# Patient Record
Sex: Male | Born: 1950 | Race: White | Hispanic: No | Smoking: Former smoker
Health system: Southern US, Community
[De-identification: ages and names within clinical notes are randomized; demographics above are authoritative.]

## PROBLEM LIST (undated history)

## (undated) DIAGNOSIS — E119 Type 2 diabetes mellitus without complications: Secondary | ICD-10-CM

## (undated) DIAGNOSIS — H9190 Unspecified hearing loss, unspecified ear: Secondary | ICD-10-CM

## (undated) DIAGNOSIS — I10 Essential (primary) hypertension: Secondary | ICD-10-CM

---

## 2021-05-23 ENCOUNTER — Other Ambulatory Visit (HOSPITAL_COMMUNITY): Payer: Medicare Other

## 2021-05-23 ENCOUNTER — Institutional Professional Consult (permissible substitution)
Admission: RE | Admit: 2021-05-23 | Discharge: 2021-06-03 | Disposition: A | Discharge status: Rehabilitation Facility | Payer: Medicare Other | Source: Ambulatory Visit | Attending: Internal Medicine | Admitting: Internal Medicine

## 2021-05-23 DIAGNOSIS — I824Z9 Acute embolism and thrombosis of unspecified deep veins of unspecified distal lower extremity: Secondary | ICD-10-CM

## 2021-05-23 DIAGNOSIS — Z931 Gastrostomy status: Secondary | ICD-10-CM

## 2021-05-23 DIAGNOSIS — J9621 Acute and chronic respiratory failure with hypoxia: Secondary | ICD-10-CM

## 2021-05-23 DIAGNOSIS — S066X9S Traumatic subarachnoid hemorrhage with loss of consciousness of unspecified duration, sequela: Secondary | ICD-10-CM

## 2021-05-23 DIAGNOSIS — S27322A Contusion of lung, bilateral, initial encounter: Secondary | ICD-10-CM

## 2021-05-23 DIAGNOSIS — J189 Pneumonia, unspecified organism: Secondary | ICD-10-CM

## 2021-05-23 DIAGNOSIS — J398 Other specified diseases of upper respiratory tract: Secondary | ICD-10-CM

## 2021-05-23 MED ORDER — DIATRIZOATE MEGLUMINE & SODIUM 66-10 % PO SOLN
ORAL | Status: AC
  Administered 2021-05-23: 20 mL via GASTROSTOMY
  Filled 2021-05-23: qty 30

## 2021-05-24 ENCOUNTER — Other Ambulatory Visit (HOSPITAL_COMMUNITY): Payer: Medicare Other

## 2021-05-24 DIAGNOSIS — J9621 Acute and chronic respiratory failure with hypoxia: Secondary | ICD-10-CM | POA: Diagnosis not present

## 2021-05-24 DIAGNOSIS — S27322A Contusion of lung, bilateral, initial encounter: Secondary | ICD-10-CM

## 2021-05-24 DIAGNOSIS — J398 Other specified diseases of upper respiratory tract: Secondary | ICD-10-CM

## 2021-05-24 DIAGNOSIS — I824Z9 Acute embolism and thrombosis of unspecified deep veins of unspecified distal lower extremity: Secondary | ICD-10-CM | POA: Diagnosis not present

## 2021-05-24 DIAGNOSIS — S066X9S Traumatic subarachnoid hemorrhage with loss of consciousness of unspecified duration, sequela: Secondary | ICD-10-CM

## 2021-05-24 DIAGNOSIS — S27322S Contusion of lung, bilateral, sequela: Secondary | ICD-10-CM | POA: Diagnosis not present

## 2021-05-24 LAB — BASIC METABOLIC PANEL
Anion gap: 14 (ref 5–15)
BUN: 22 mg/dL (ref 8–23)
CO2: 25 mmol/L (ref 22–32)
Calcium: 8.7 mg/dL — ABNORMAL LOW (ref 8.9–10.3)
Chloride: 100 mmol/L (ref 98–111)
Creatinine, Ser: 0.96 mg/dL (ref 0.61–1.24)
GFR, Estimated: >60 mL/min (ref >60)
Glucose, Bld: 179 mg/dL — ABNORMAL HIGH (ref 70–99)
Potassium: 4 mmol/L (ref 3.5–5.1)
Sodium: 139 mmol/L (ref 135–145)

## 2021-05-24 LAB — CBC
HCT: 36.7 % — ABNORMAL LOW (ref 39.0–52.0)
Hemoglobin: 11.9 g/dL — ABNORMAL LOW (ref 13.0–17.0)
MCH: 30.8 pg (ref 26.0–34.0)
MCHC: 32.4 g/dL (ref 30.0–36.0)
MCV: 95.1 fL (ref 80.0–100.0)
Platelets: 145 10*3/uL — ABNORMAL LOW (ref 150–400)
RBC: 3.86 MIL/uL — ABNORMAL LOW (ref 4.22–5.81)
RDW: 15.2 % (ref 11.5–15.5)
WBC: 6.8 10*3/uL (ref 4.0–10.5)
nRBC: 0 % (ref 0.0–0.2)

## 2021-05-24 LAB — URINALYSIS, ROUTINE W REFLEX MICROSCOPIC
Bilirubin Urine: NEGATIVE
Glucose, UA: NEGATIVE mg/dL
Hgb urine dipstick: NEGATIVE
Ketones, ur: NEGATIVE mg/dL
Leukocytes,Ua: NEGATIVE
Nitrite: NEGATIVE
Protein, ur: NEGATIVE mg/dL
Specific Gravity, Urine: 1.02 (ref 1.005–1.030)
pH: 6 (ref 5.0–8.0)
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
## 2021-05-25 DIAGNOSIS — I824Z9 Acute embolism and thrombosis of unspecified deep veins of unspecified distal lower extremity: Secondary | ICD-10-CM | POA: Diagnosis not present

## 2021-05-25 DIAGNOSIS — J398 Other specified diseases of upper respiratory tract: Secondary | ICD-10-CM | POA: Diagnosis not present

## 2021-05-25 DIAGNOSIS — J9621 Acute and chronic respiratory failure with hypoxia: Secondary | ICD-10-CM | POA: Diagnosis not present

## 2021-05-25 DIAGNOSIS — S27322S Contusion of lung, bilateral, sequela: Secondary | ICD-10-CM | POA: Diagnosis not present

## 2021-05-25 LAB — CBC
HCT: 39 % (ref 39.0–52.0)
Hemoglobin: 12.6 g/dL — ABNORMAL LOW (ref 13.0–17.0)
MCH: 31 pg (ref 26.0–34.0)
MCHC: 32.3 g/dL (ref 30.0–36.0)
MCV: 95.8 fL (ref 80.0–100.0)
Platelets: 149 10*3/uL — ABNORMAL LOW (ref 150–400)
RBC: 4.07 MIL/uL — ABNORMAL LOW (ref 4.22–5.81)
RDW: 15 % (ref 11.5–15.5)
WBC: 7.3 10*3/uL (ref 4.0–10.5)
nRBC: 0 % (ref 0.0–0.2)

## 2021-05-25 LAB — COMPREHENSIVE METABOLIC PANEL
ALT: 22 U/L (ref 0–44)
AST: 30 U/L (ref 15–41)
Albumin: 2.5 g/dL — ABNORMAL LOW (ref 3.5–5.0)
Alkaline Phosphatase: 81 U/L (ref 38–126)
Anion gap: 6 (ref 5–15)
BUN: 30 mg/dL — ABNORMAL HIGH (ref 8–23)
CO2: 30 mmol/L (ref 22–32)
Calcium: 9.1 mg/dL (ref 8.9–10.3)
Chloride: 104 mmol/L (ref 98–111)
Creatinine, Ser: 1.02 mg/dL (ref 0.61–1.24)
GFR, Estimated: >60 mL/min (ref >60)
Glucose, Bld: 91 mg/dL (ref 70–99)
Potassium: 4 mmol/L (ref 3.5–5.1)
Sodium: 140 mmol/L (ref 135–145)
Total Bilirubin: 0.6 mg/dL (ref 0.3–1.2)
Total Protein: 6.5 g/dL (ref 6.5–8.1)

## 2021-05-25 LAB — TSH: TSH: 1.078 u[IU]/mL (ref 0.350–4.500)

## 2021-05-25 LAB — URINE CULTURE: Culture: NO GROWTH

## 2021-05-25 LAB — HEMOGLOBIN A1C
Hgb A1c MFr Bld: 6.9 % — ABNORMAL HIGH (ref 4.8–5.6)
Mean Plasma Glucose: 151.33 mg/dL

## 2021-05-25 LAB — PHOSPHORUS: Phosphorus: 4 mg/dL (ref 2.5–4.6)

## 2021-05-25 LAB — MAGNESIUM: Magnesium: 2.1 mg/dL (ref 1.7–2.4)

## 2021-05-25 NOTE — Progress Notes (Signed)
Pulmonary Critical Care Medicine [facility] [place]   PULMONARY CRITICAL CARE SERVICE  PROGRESS NOTE     John Bryan  [id number]  DOB: 08-07-1951   DOA: 05/23/2021  Referring Physician: Carron Curie, MD  HPI: John Bryan is a 70 y.o. male being followed for ventilator/airway/oxygen weaning Acute on Chronic Respiratory Failure.  Patient at this time is comfortable right now without distress has been on T collar on 28% of 2 using the PMV  Medications: Reviewed on Rounds  Physical Exam:  Vitals: Temperature is 98.7 pulse 89 respiratory 30 blood pressure is 122/60 saturations 99%  Ventilator Settings on T collar FiO2 28% using PMV  General: Comfortable at this time Neck: supple Cardiovascular: no malignant arrhythmias Respiratory: No rhonchi very coarse breath sound Skin: no rash seen on limited exam Musculoskeletal: No gross abnormality Psychiatric:unable to assess Neurologic:no involuntary movements         Lab Data:   Basic Metabolic Panel: Recent Labs  Lab 05/24/21 0349 05/25/21 0424  NA 139 140  K 4.0 4.0  CL 100 104  CO2 25 30  GLUCOSE 179* 91  BUN 22 30*  CREATININE 0.96 1.02  CALCIUM 8.7* 9.1  MG  --  2.1  PHOS  --  4.0    ABG: No results for input(s): PHART, PCO2ART, PO2ART, HCO3, O2SAT in the last 168 hours.  Liver Function Tests: Recent Labs  Lab 05/25/21 0424  AST 30  ALT 22  ALKPHOS 81  BILITOT 0.6  PROT 6.5  ALBUMIN 2.5*   No results for input(s): LIPASE, AMYLASE in the last 168 hours. No results for input(s): AMMONIA in the last 168 hours.  CBC: Recent Labs  Lab 05/24/21 0349 05/25/21 0424  WBC 6.8 7.3  HGB 11.9* 12.6*  HCT 36.7* 39.0  MCV 95.1 95.8  PLT 145* 149*    Cardiac Enzymes: No results for input(s): CKTOTAL, CKMB, CKMBINDEX, TROPONINI in the last 168 hours.  BNP (last 3 results) No results for input(s): BNP in the last 8760 hours.  ProBNP (last 3 results) No results for input(s):  PROBNP in the last 8760 hours.  Radiological Exams: DG ABDOMEN PEG TUBE LOCATION  Result Date: 05/23/2021 CLINICAL DATA:  Check gastric catheter placement EXAM: ABDOMEN - 1 VIEW COMPARISON:  None. FINDINGS: Contrast material injected through the gastrostomy flows freely into the stomach. No other focal abnormality is noted. IMPRESSION: Gastrostomy catheter within the stomach. Electronically Signed   By: Alcide Clever M.D.   On: 05/23/2021 23:10   DG Chest Port 1 View  Result Date: 05/24/2021 CLINICAL DATA:  Pneumonia. EXAM: PORTABLE CHEST 1 VIEW COMPARISON:  No prior. FINDINGS: Tracheostomy tube noted good anatomic position. Heart size normal. Low lung volumes. No focal infiltrate. No pleural effusion or pneumothorax. Prior cervical spine fusion. IMPRESSION: 1.  Tracheostomy tube noted good anatomic position. 2.  Low lung volumes.  No acute infiltrate. Electronically Signed   By: Maisie Fus  Register   On: 05/24/2021 12:00    Assessment/Plan Active Problems:   Acute on chronic respiratory failure with hypoxia (HCC)   Increased tracheal secretions   Traumatic subarachnoid hemorrhage with loss of consciousness of unspecified duration, sequela (HCC)   DVT, lower extremity, distal, acute (HCC)   Bilateral pulmonary contusion   Acute on chronic respiratory failure with hypoxia patient has been tolerating the PMV doing well.  Plan is going to be to continue to advance as tolerated.  The patient's daughter was at bedside she was updated.  Explained  that the reason that he has mittens on basically is because he does have a tendency to pull at his lines and catheter Increased tracheal secretions requires aggressive pulmonary toilet we will continue to monitor closely. Traumatic subarachnoid hemorrhage no change we will continue to follow along closely. DVT treated supportive care Bilateral pulmonary contusions slow to improve we will continue to monitor closely.   I have personally seen and evaluated  the patient, evaluated laboratory and imaging results, formulated the assessment and plan and placed orders. The Patient requires high complexity decision making with multiple systems involvement.  Rounds were done with the Respiratory Therapy Director and Staff therapists and discussed with nursing staff also.  Yevonne Pax, MD [facility] Pulmonary Critical Care Medicine Sleep Medicine

## 2021-05-26 DIAGNOSIS — S27322S Contusion of lung, bilateral, sequela: Secondary | ICD-10-CM | POA: Diagnosis not present

## 2021-05-26 DIAGNOSIS — I824Z9 Acute embolism and thrombosis of unspecified deep veins of unspecified distal lower extremity: Secondary | ICD-10-CM | POA: Diagnosis not present

## 2021-05-26 DIAGNOSIS — J9621 Acute and chronic respiratory failure with hypoxia: Secondary | ICD-10-CM | POA: Diagnosis not present

## 2021-05-26 DIAGNOSIS — J398 Other specified diseases of upper respiratory tract: Secondary | ICD-10-CM | POA: Diagnosis not present

## 2021-05-26 LAB — CULTURE, RESPIRATORY W GRAM STAIN

## 2021-05-26 NOTE — Progress Notes (Signed)
Pulmonary Critical Care Medicine [facility] [place]   PULMONARY CRITICAL CARE SERVICE  PROGRESS NOTE     GAD AYMOND  OVZ:[id number]  DOB: 09/16/1951   DOA: 05/23/2021  Referring Physician: Carron Curie, MD  HPI: John Bryan is a 70 y.o. male being followed for ventilator/airway/oxygen weaning Acute on Chronic Respiratory Failure.  Patient is on T collar at this time patient is comfortable and ready for capping trials  Medications: Reviewed on Rounds  Physical Exam:  Vitals: Temperature is 98.8 pulse 83 respiratory 23 blood pressure is 124/66 saturations 98%  Ventilator Settings on T collar FiO2 28%  General: Comfortable at this time Neck: supple Cardiovascular: no malignant arrhythmias Respiratory: No rhonchi no rales are noted at this time Skin: no rash seen on limited exam Musculoskeletal: No gross abnormality Psychiatric:unable to assess Neurologic:no involuntary movements         Lab Data:   Basic Metabolic Panel: Recent Labs  Lab 05/24/21 0349 05/25/21 0424  NA 139 140  K 4.0 4.0  CL 100 104  CO2 25 30  GLUCOSE 179* 91  BUN 22 30*  CREATININE 0.96 1.02  CALCIUM 8.7* 9.1  MG  --  2.1  PHOS  --  4.0    ABG: No results for input(s): PHART, PCO2ART, PO2ART, HCO3, O2SAT in the last 168 hours.  Liver Function Tests: Recent Labs  Lab 05/25/21 0424  AST 30  ALT 22  ALKPHOS 81  BILITOT 0.6  PROT 6.5  ALBUMIN 2.5*   No results for input(s): LIPASE, AMYLASE in the last 168 hours. No results for input(s): AMMONIA in the last 168 hours.  CBC: Recent Labs  Lab 05/24/21 0349 05/25/21 0424  WBC 6.8 7.3  HGB 11.9* 12.6*  HCT 36.7* 39.0  MCV 95.1 95.8  PLT 145* 149*    Cardiac Enzymes: No results for input(s): CKTOTAL, CKMB, CKMBINDEX, TROPONINI in the last 168 hours.  BNP (last 3 results) No results for input(s): BNP in the last 8760 hours.  ProBNP (last 3 results) No results for input(s): PROBNP in the last 8760  hours.  Radiological Exams: DG Chest Port 1 View  Result Date: 05/24/2021 CLINICAL DATA:  Pneumonia. EXAM: PORTABLE CHEST 1 VIEW COMPARISON:  No prior. FINDINGS: Tracheostomy tube noted good anatomic position. Heart size normal. Low lung volumes. No focal infiltrate. No pleural effusion or pneumothorax. Prior cervical spine fusion. IMPRESSION: 1.  Tracheostomy tube noted good anatomic position. 2.  Low lung volumes.  No acute infiltrate. Electronically Signed   By: Maisie Fus  Register   On: 05/24/2021 12:00    Assessment/Plan Active Problems:   Acute on chronic respiratory failure with hypoxia (HCC)   Increased tracheal secretions   Traumatic subarachnoid hemorrhage with loss of consciousness of unspecified duration, sequela (HCC)   DVT, lower extremity, distal, acute (HCC)   Bilateral pulmonary contusion   Acute on chronic respiratory failure with hypoxia we will continue with T-piece today we will attempt capping if patient is able to tolerate will advance Traumatic subarachnoid hemorrhage no change DVT treated we will continue with supportive care Pulmonary contusion low lung volumes noted on the last chest film no pneumothorax no infiltrate Retained secretions continue with aggressive pulmonary toilet   I have personally seen and evaluated the patient, evaluated laboratory and imaging results, formulated the assessment and plan and placed orders. The Patient requires high complexity decision making with multiple systems involvement.  Rounds were done with the Respiratory Therapy Director and Staff therapists and discussed  with nursing staff also.  Allyne Gee, MD [facility] Pulmonary Critical Care Medicine Sleep Medicine

## 2021-05-27 DIAGNOSIS — I824Z9 Acute embolism and thrombosis of unspecified deep veins of unspecified distal lower extremity: Secondary | ICD-10-CM | POA: Diagnosis not present

## 2021-05-27 DIAGNOSIS — S27322S Contusion of lung, bilateral, sequela: Secondary | ICD-10-CM | POA: Diagnosis not present

## 2021-05-27 DIAGNOSIS — J9621 Acute and chronic respiratory failure with hypoxia: Secondary | ICD-10-CM | POA: Diagnosis not present

## 2021-05-27 DIAGNOSIS — J398 Other specified diseases of upper respiratory tract: Secondary | ICD-10-CM | POA: Diagnosis not present

## 2021-05-27 LAB — BASIC METABOLIC PANEL
Anion gap: 8 (ref 5–15)
BUN: 30 mg/dL — ABNORMAL HIGH (ref 8–23)
CO2: 31 mmol/L (ref 22–32)
Calcium: 9 mg/dL (ref 8.9–10.3)
Chloride: 104 mmol/L (ref 98–111)
Creatinine, Ser: 1 mg/dL (ref 0.61–1.24)
GFR, Estimated: >60 mL/min (ref >60)
Glucose, Bld: 122 mg/dL — ABNORMAL HIGH (ref 70–99)
Potassium: 3.9 mmol/L (ref 3.5–5.1)
Sodium: 143 mmol/L (ref 135–145)

## 2021-05-27 LAB — CBC
HCT: 35.8 % — ABNORMAL LOW (ref 39.0–52.0)
Hemoglobin: 11.2 g/dL — ABNORMAL LOW (ref 13.0–17.0)
MCH: 30.4 pg (ref 26.0–34.0)
MCHC: 31.3 g/dL (ref 30.0–36.0)
MCV: 97 fL (ref 80.0–100.0)
Platelets: 169 10*3/uL (ref 150–400)
RBC: 3.69 MIL/uL — ABNORMAL LOW (ref 4.22–5.81)
RDW: 15 % (ref 11.5–15.5)
WBC: 7.5 10*3/uL (ref 4.0–10.5)
nRBC: 0 % (ref 0.0–0.2)

## 2021-05-27 LAB — MAGNESIUM: Magnesium: 1.9 mg/dL (ref 1.7–2.4)

## 2021-05-27 LAB — PHOSPHORUS: Phosphorus: 4 mg/dL (ref 2.5–4.6)

## 2021-05-27 NOTE — Progress Notes (Signed)
Pulmonary Critical Care Medicine [facility] [place]   PULMONARY CRITICAL CARE SERVICE  PROGRESS NOTE     John Bryan  [id number]:[id number]  DOB: 09-Jul-1951   DOA: 05/23/2021  Referring Physician: Carron Curie, MD  HPI: John Bryan is a 70 y.o. male being followed for ventilator/airway/oxygen weaning Acute on Chronic Respiratory Failure.  Patient this morning is capping looks good has been tolerating it without any issues.  Medications: Reviewed on Rounds  Physical Exam:  Vitals: Temperature is 98.0 pulse 85 respiratory 16 blood pressure 119/70 saturations 97%  Ventilator Settings capping off the ventilator  General: Comfortable at this time Neck: supple Cardiovascular: no malignant arrhythmias Respiratory: No rhonchi very coarse percent Skin: no rash seen on limited exam Musculoskeletal: No gross abnormality Psychiatric:unable to assess Neurologic:no involuntary movements         Lab Data:   Basic Metabolic Panel: Recent Labs  Lab 05/24/21 0349 05/25/21 0424 05/27/21 0427  NA 139 140 143  K 4.0 4.0 3.9  CL 100 104 104  CO2 25 30 31   GLUCOSE 179* 91 122*  BUN 22 30* 30*  CREATININE 0.96 1.02 1.00  CALCIUM 8.7* 9.1 9.0  MG  --  2.1 1.9  PHOS  --  4.0 4.0    ABG: No results for input(s): PHART, PCO2ART, PO2ART, HCO3, O2SAT in the last 168 hours.  Liver Function Tests: Recent Labs  Lab 05/25/21 0424  AST 30  ALT 22  ALKPHOS 81  BILITOT 0.6  PROT 6.5  ALBUMIN 2.5*   No results for input(s): LIPASE, AMYLASE in the last 168 hours. No results for input(s): AMMONIA in the last 168 hours.  CBC: Recent Labs  Lab 05/24/21 0349 05/25/21 0424 05/27/21 0427  WBC 6.8 7.3 7.5  HGB 11.9* 12.6* 11.2*  HCT 36.7* 39.0 35.8*  MCV 95.1 95.8 97.0  PLT 145* 149* 169    Cardiac Enzymes: No results for input(s): CKTOTAL, CKMB, CKMBINDEX, TROPONINI in the last 168 hours.  BNP (last 3 results) No results for input(s): BNP in the last 8760  hours.  ProBNP (last 3 results) No results for input(s): PROBNP in the last 8760 hours.  Radiological Exams: No results found.  Assessment/Plan Active Problems:   Acute on chronic respiratory failure with hypoxia (HCC)   Increased tracheal secretions   Traumatic subarachnoid hemorrhage with loss of consciousness of unspecified duration, sequela (HCC)   DVT, lower extremity, distal, acute (HCC)   Bilateral pulmonary contusion   Acute on chronic respiratory failure with hypoxia we will continue with capping and weaning as tolerated.  Continue secretion management supportive care. Increased tracheal secretions no change we will continue to follow along closely Subarachnoid hemorrhage supportive care DVT has been on anticoagulation Bilateral pulmonary contusions showing improvement   I have personally seen and evaluated the patient, evaluated laboratory and imaging results, formulated the assessment and plan and placed orders. The Patient requires high complexity decision making with multiple systems involvement.  Rounds were done with the Respiratory Therapy Director and Staff therapists and discussed with nursing staff also.  05/29/21, MD [facility] Pulmonary Critical Care Medicine Sleep Medicine

## 2021-05-29 LAB — CBC
HCT: 37.7 % — ABNORMAL LOW (ref 39.0–52.0)
Hemoglobin: 11.5 g/dL — ABNORMAL LOW (ref 13.0–17.0)
MCH: 29.9 pg (ref 26.0–34.0)
MCHC: 30.5 g/dL (ref 30.0–36.0)
MCV: 97.9 fL (ref 80.0–100.0)
Platelets: 216 10*3/uL (ref 150–400)
RBC: 3.85 MIL/uL — ABNORMAL LOW (ref 4.22–5.81)
RDW: 14.9 % (ref 11.5–15.5)
WBC: 6.8 10*3/uL (ref 4.0–10.5)
nRBC: 0 % (ref 0.0–0.2)

## 2021-05-29 LAB — PHOSPHORUS: Phosphorus: 4.4 mg/dL (ref 2.5–4.6)

## 2021-05-29 LAB — BASIC METABOLIC PANEL
Anion gap: 5 (ref 5–15)
BUN: 30 mg/dL — ABNORMAL HIGH (ref 8–23)
CO2: 30 mmol/L (ref 22–32)
Calcium: 9.1 mg/dL (ref 8.9–10.3)
Chloride: 108 mmol/L (ref 98–111)
Creatinine, Ser: 0.99 mg/dL (ref 0.61–1.24)
GFR, Estimated: >60 mL/min (ref >60)
Glucose, Bld: 95 mg/dL (ref 70–99)
Potassium: 3.7 mmol/L (ref 3.5–5.1)
Sodium: 143 mmol/L (ref 135–145)

## 2021-05-29 LAB — MAGNESIUM: Magnesium: 1.8 mg/dL (ref 1.7–2.4)

## 2021-05-30 DIAGNOSIS — I824Z9 Acute embolism and thrombosis of unspecified deep veins of unspecified distal lower extremity: Secondary | ICD-10-CM | POA: Diagnosis not present

## 2021-05-30 DIAGNOSIS — J9621 Acute and chronic respiratory failure with hypoxia: Secondary | ICD-10-CM | POA: Diagnosis not present

## 2021-05-30 DIAGNOSIS — J398 Other specified diseases of upper respiratory tract: Secondary | ICD-10-CM | POA: Diagnosis not present

## 2021-05-30 DIAGNOSIS — S27322S Contusion of lung, bilateral, sequela: Secondary | ICD-10-CM | POA: Diagnosis not present

## 2021-05-31 NOTE — Progress Notes (Signed)
Pulmonary Critical Care Medicine [facility] [place]   PULMONARY CRITICAL CARE SERVICE  PROGRESS NOTE     John Bryan  [id number]  DOB: Oct 27, 1951   DOA: 05/23/2021  Referring Physician: Carron Curie, MD  HPI: John Bryan is a 70 y.o. male being followed for ventilator/airway/oxygen weaning Acute on Chronic Respiratory Failure.  Patient is capping on room air has done quite well and is ready for decannulation today  Medications: Reviewed on Rounds  Physical Exam:  Vitals: Temperature is 97.3 pulse 70 respiratory 22 blood pressure is 150/67 saturations 99%  Ventilator Settings capping on room air  General: Comfortable at this time Neck: supple Cardiovascular: no malignant arrhythmias Respiratory: No rhonchi no rales are noted at this time Skin: no rash seen on limited exam Musculoskeletal: No gross abnormality Psychiatric:unable to assess Neurologic:no involuntary movements         Lab Data:   Basic Metabolic Panel: Recent Labs  Lab 05/25/21 0424 05/27/21 0427 05/29/21 0339  NA 140 143 143  K 4.0 3.9 3.7  CL 104 104 108  CO2 30 31 30   GLUCOSE 91 122* 95  BUN 30* 30* 30*  CREATININE 1.02 1.00 0.99  CALCIUM 9.1 9.0 9.1  MG 2.1 1.9 1.8  PHOS 4.0 4.0 4.4    ABG: No results for input(s): PHART, PCO2ART, PO2ART, HCO3, O2SAT in the last 168 hours.  Liver Function Tests: Recent Labs  Lab 05/25/21 0424  AST 30  ALT 22  ALKPHOS 81  BILITOT 0.6  PROT 6.5  ALBUMIN 2.5*   No results for input(s): LIPASE, AMYLASE in the last 168 hours. No results for input(s): AMMONIA in the last 168 hours.  CBC: Recent Labs  Lab 05/25/21 0424 05/27/21 0427 05/29/21 0339  WBC 7.3 7.5 6.8  HGB 12.6* 11.2* 11.5*  HCT 39.0 35.8* 37.7*  MCV 95.8 97.0 97.9  PLT 149* 169 216    Cardiac Enzymes: No results for input(s): CKTOTAL, CKMB, CKMBINDEX, TROPONINI in the last 168 hours.  BNP (last 3 results) No results for input(s): BNP in the last  8760 hours.  ProBNP (last 3 results) No results for input(s): PROBNP in the last 8760 hours.  Radiological Exams: No results found.  Assessment/Plan Active Problems:   Acute on chronic respiratory failure with hypoxia (HCC)   Increased tracheal secretions   Traumatic subarachnoid hemorrhage with loss of consciousness of unspecified duration, sequela (HCC)   DVT, lower extremity, distal, acute (HCC)   Bilateral pulmonary contusion   Acute on chronic respiratory failure hypoxia plan is to proceed to decannulation Tracheal secretions have improved clinically we will continue to monitor. Traumatic subarachnoid hemorrhage no change we will continue with supportive care DVT treated Pulmonary contusion with supportive care clinically is improved   I have personally seen and evaluated the patient, evaluated laboratory and imaging results, formulated the assessment and plan and placed orders. The Patient requires high complexity decision making with multiple systems involvement.  Rounds were done with the Respiratory Therapy Director and Staff therapists and discussed with nursing staff also.  05/31/21, MD [facility] Pulmonary Critical Care Medicine Sleep Medicine

## 2021-06-02 ENCOUNTER — Encounter: Payer: Self-pay | Admitting: Physical Therapy

## 2021-06-02 LAB — BASIC METABOLIC PANEL
Anion gap: 7 (ref 5–15)
BUN: 26 mg/dL — ABNORMAL HIGH (ref 8–23)
CO2: 29 mmol/L (ref 22–32)
Calcium: 8.7 mg/dL — ABNORMAL LOW (ref 8.9–10.3)
Chloride: 101 mmol/L (ref 98–111)
Creatinine, Ser: 0.94 mg/dL (ref 0.61–1.24)
GFR, Estimated: >60 mL/min (ref >60)
Glucose, Bld: 141 mg/dL — ABNORMAL HIGH (ref 70–99)
Potassium: 3.8 mmol/L (ref 3.5–5.1)
Sodium: 137 mmol/L (ref 135–145)

## 2021-06-02 LAB — CBC
HCT: 40.8 % (ref 39.0–52.0)
Hemoglobin: 13.1 g/dL (ref 13.0–17.0)
MCH: 30.1 pg (ref 26.0–34.0)
MCHC: 32.1 g/dL (ref 30.0–36.0)
MCV: 93.8 fL (ref 80.0–100.0)
Platelets: 292 10*3/uL (ref 150–400)
RBC: 4.35 MIL/uL (ref 4.22–5.81)
RDW: 14.6 % (ref 11.5–15.5)
WBC: 7.8 10*3/uL (ref 4.0–10.5)
nRBC: 0 % (ref 0.0–0.2)
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
## 2021-06-03 ENCOUNTER — Inpatient Hospital Stay (HOSPITAL_COMMUNITY)
Admission: RE | Admit: 2021-06-03 | Discharge: 2021-07-05 | DRG: 092 | Disposition: A | Discharge status: Home Health | Payer: No Typology Code available for payment source | Source: Other Acute Inpatient Hospital | Attending: Physical Medicine & Rehabilitation | Admitting: Physical Medicine & Rehabilitation

## 2021-06-03 ENCOUNTER — Other Ambulatory Visit: Payer: Self-pay

## 2021-06-03 ENCOUNTER — Encounter (HOSPITAL_COMMUNITY): Payer: Self-pay | Admitting: Physical Medicine & Rehabilitation

## 2021-06-03 DIAGNOSIS — G4700 Insomnia, unspecified: Secondary | ICD-10-CM | POA: Diagnosis present

## 2021-06-03 DIAGNOSIS — K219 Gastro-esophageal reflux disease without esophagitis: Secondary | ICD-10-CM | POA: Diagnosis present

## 2021-06-03 DIAGNOSIS — S2241XD Multiple fractures of ribs, right side, subsequent encounter for fracture with routine healing: Secondary | ICD-10-CM | POA: Diagnosis not present

## 2021-06-03 DIAGNOSIS — R451 Restlessness and agitation: Secondary | ICD-10-CM | POA: Diagnosis not present

## 2021-06-03 DIAGNOSIS — Z7901 Long term (current) use of anticoagulants: Secondary | ICD-10-CM

## 2021-06-03 DIAGNOSIS — H9190 Unspecified hearing loss, unspecified ear: Secondary | ICD-10-CM | POA: Diagnosis present

## 2021-06-03 DIAGNOSIS — M47816 Spondylosis without myelopathy or radiculopathy, lumbar region: Secondary | ICD-10-CM | POA: Diagnosis present

## 2021-06-03 DIAGNOSIS — E11649 Type 2 diabetes mellitus with hypoglycemia without coma: Secondary | ICD-10-CM | POA: Diagnosis not present

## 2021-06-03 DIAGNOSIS — D72829 Elevated white blood cell count, unspecified: Secondary | ICD-10-CM | POA: Diagnosis not present

## 2021-06-03 DIAGNOSIS — R1312 Dysphagia, oropharyngeal phase: Secondary | ICD-10-CM | POA: Diagnosis not present

## 2021-06-03 DIAGNOSIS — A0472 Enterocolitis due to Clostridium difficile, not specified as recurrent: Secondary | ICD-10-CM | POA: Diagnosis present

## 2021-06-03 DIAGNOSIS — S069XAA Unspecified intracranial injury with loss of consciousness status unknown, initial encounter: Secondary | ICD-10-CM | POA: Diagnosis present

## 2021-06-03 DIAGNOSIS — Z86718 Personal history of other venous thrombosis and embolism: Secondary | ICD-10-CM

## 2021-06-03 DIAGNOSIS — M7062 Trochanteric bursitis, left hip: Secondary | ICD-10-CM | POA: Diagnosis present

## 2021-06-03 DIAGNOSIS — S62637D Displaced fracture of distal phalanx of left little finger, subsequent encounter for fracture with routine healing: Secondary | ICD-10-CM | POA: Diagnosis not present

## 2021-06-03 DIAGNOSIS — G44319 Acute post-traumatic headache, not intractable: Secondary | ICD-10-CM | POA: Diagnosis not present

## 2021-06-03 DIAGNOSIS — E1169 Type 2 diabetes mellitus with other specified complication: Secondary | ICD-10-CM | POA: Diagnosis not present

## 2021-06-03 DIAGNOSIS — K760 Fatty (change of) liver, not elsewhere classified: Secondary | ICD-10-CM | POA: Diagnosis present

## 2021-06-03 DIAGNOSIS — Z79899 Other long term (current) drug therapy: Secondary | ICD-10-CM

## 2021-06-03 DIAGNOSIS — R197 Diarrhea, unspecified: Secondary | ICD-10-CM | POA: Diagnosis not present

## 2021-06-03 DIAGNOSIS — K909 Intestinal malabsorption, unspecified: Secondary | ICD-10-CM | POA: Diagnosis not present

## 2021-06-03 DIAGNOSIS — M5136 Other intervertebral disc degeneration, lumbar region: Secondary | ICD-10-CM | POA: Diagnosis present

## 2021-06-03 DIAGNOSIS — S0240CD Maxillary fracture, right side, subsequent encounter for fracture with routine healing: Secondary | ICD-10-CM | POA: Diagnosis not present

## 2021-06-03 DIAGNOSIS — Z87891 Personal history of nicotine dependence: Secondary | ICD-10-CM

## 2021-06-03 DIAGNOSIS — S069X0S Unspecified intracranial injury without loss of consciousness, sequela: Secondary | ICD-10-CM | POA: Diagnosis not present

## 2021-06-03 DIAGNOSIS — E039 Hypothyroidism, unspecified: Secondary | ICD-10-CM | POA: Diagnosis present

## 2021-06-03 DIAGNOSIS — G44311 Acute post-traumatic headache, intractable: Secondary | ICD-10-CM | POA: Diagnosis not present

## 2021-06-03 DIAGNOSIS — D649 Anemia, unspecified: Secondary | ICD-10-CM | POA: Diagnosis present

## 2021-06-03 DIAGNOSIS — S0240FD Zygomatic fracture, left side, subsequent encounter for fracture with routine healing: Secondary | ICD-10-CM | POA: Diagnosis not present

## 2021-06-03 DIAGNOSIS — S020XXS Fracture of vault of skull, sequela: Secondary | ICD-10-CM

## 2021-06-03 DIAGNOSIS — M545 Low back pain, unspecified: Secondary | ICD-10-CM

## 2021-06-03 DIAGNOSIS — R6889 Other general symptoms and signs: Secondary | ICD-10-CM | POA: Diagnosis not present

## 2021-06-03 DIAGNOSIS — Z79890 Hormone replacement therapy (postmenopausal): Secondary | ICD-10-CM

## 2021-06-03 DIAGNOSIS — G40909 Epilepsy, unspecified, not intractable, without status epilepticus: Secondary | ICD-10-CM | POA: Diagnosis present

## 2021-06-03 DIAGNOSIS — G44309 Post-traumatic headache, unspecified, not intractable: Secondary | ICD-10-CM | POA: Diagnosis not present

## 2021-06-03 DIAGNOSIS — Z794 Long term (current) use of insulin: Secondary | ICD-10-CM

## 2021-06-03 DIAGNOSIS — S066X9S Traumatic subarachnoid hemorrhage with loss of consciousness of unspecified duration, sequela: Secondary | ICD-10-CM | POA: Diagnosis present

## 2021-06-03 DIAGNOSIS — R1310 Dysphagia, unspecified: Secondary | ICD-10-CM | POA: Diagnosis present

## 2021-06-03 DIAGNOSIS — F32A Depression, unspecified: Secondary | ICD-10-CM | POA: Diagnosis present

## 2021-06-03 DIAGNOSIS — G8929 Other chronic pain: Secondary | ICD-10-CM | POA: Diagnosis present

## 2021-06-03 DIAGNOSIS — E669 Obesity, unspecified: Secondary | ICD-10-CM | POA: Diagnosis not present

## 2021-06-03 DIAGNOSIS — Z931 Gastrostomy status: Secondary | ICD-10-CM | POA: Diagnosis not present

## 2021-06-03 DIAGNOSIS — S069X3S Unspecified intracranial injury with loss of consciousness of 1 hour to 5 hours 59 minutes, sequela: Secondary | ICD-10-CM | POA: Diagnosis not present

## 2021-06-03 DIAGNOSIS — R110 Nausea: Secondary | ICD-10-CM

## 2021-06-03 DIAGNOSIS — R42 Dizziness and giddiness: Secondary | ICD-10-CM | POA: Diagnosis present

## 2021-06-03 DIAGNOSIS — Z5320 Procedure and treatment not carried out because of patient's decision for unspecified reasons: Secondary | ICD-10-CM | POA: Diagnosis not present

## 2021-06-03 DIAGNOSIS — E119 Type 2 diabetes mellitus without complications: Secondary | ICD-10-CM

## 2021-06-03 DIAGNOSIS — R159 Full incontinence of feces: Secondary | ICD-10-CM | POA: Diagnosis not present

## 2021-06-03 DIAGNOSIS — S069X9A Unspecified intracranial injury with loss of consciousness of unspecified duration, initial encounter: Secondary | ICD-10-CM | POA: Diagnosis present

## 2021-06-03 DIAGNOSIS — Z888 Allergy status to other drugs, medicaments and biological substances status: Secondary | ICD-10-CM

## 2021-06-03 DIAGNOSIS — S0219XD Other fracture of base of skull, subsequent encounter for fracture with routine healing: Secondary | ICD-10-CM | POA: Diagnosis not present

## 2021-06-03 DIAGNOSIS — I10 Essential (primary) hypertension: Secondary | ICD-10-CM | POA: Diagnosis present

## 2021-06-03 DIAGNOSIS — K224 Dyskinesia of esophagus: Secondary | ICD-10-CM | POA: Diagnosis not present

## 2021-06-03 DIAGNOSIS — Z741 Need for assistance with personal care: Secondary | ICD-10-CM | POA: Diagnosis present

## 2021-06-03 HISTORY — DX: Unspecified hearing loss, unspecified ear: H91.90

## 2021-06-03 HISTORY — DX: Type 2 diabetes mellitus without complications: E11.9

## 2021-06-03 HISTORY — DX: Essential (primary) hypertension: I10

## 2021-06-03 LAB — GLUCOSE, CAPILLARY
Glucose-Capillary: 79 mg/dL (ref 70–99)
Glucose-Capillary: 74 mg/dL (ref 70–99)

## 2021-06-03 MED ORDER — PROSOURCE TF PO LIQD
90.0000 mL | Freq: Two times a day (BID) | ORAL | Status: DC
  Administered 2021-06-03 – 2021-07-05 (×64): 90 mL
  Filled 2021-06-03 (×64): qty 90

## 2021-06-03 MED ORDER — PROPRANOLOL HCL 20 MG PO TABS
40.0000 mg | ORAL_TABLET | Freq: Three times a day (TID) | ORAL | Status: DC
  Administered 2021-06-03 – 2021-06-26 (×68): 40 mg
  Filled 2021-06-03 (×70): qty 2

## 2021-06-03 MED ORDER — ACETAMINOPHEN 325 MG PO TABS
325.0000 mg | ORAL_TABLET | ORAL | Status: DC | PRN
  Administered 2021-06-03 – 2021-06-15 (×11): 650 mg
  Filled 2021-06-03 (×13): qty 2

## 2021-06-03 MED ORDER — OXYCODONE HCL 5 MG PO TABS
5.0000 mg | ORAL_TABLET | ORAL | Status: DC | PRN
  Administered 2021-06-05 – 2021-06-23 (×24): 5 mg
  Filled 2021-06-03 (×24): qty 1

## 2021-06-03 MED ORDER — CLONIDINE HCL 0.1 MG PO TABS
0.1000 mg | ORAL_TABLET | Freq: Three times a day (TID) | ORAL | Status: DC
  Administered 2021-06-03 – 2021-06-11 (×23): 0.1 mg
  Filled 2021-06-03 (×24): qty 1

## 2021-06-03 MED ORDER — SERTRALINE HCL 50 MG PO TABS
50.0000 mg | ORAL_TABLET | Freq: Every day | ORAL | Status: DC
  Administered 2021-06-04 – 2021-06-26 (×23): 50 mg
  Filled 2021-06-03 (×23): qty 1

## 2021-06-03 MED ORDER — ATORVASTATIN CALCIUM 10 MG PO TABS
20.0000 mg | ORAL_TABLET | Freq: Every day | ORAL | Status: DC
  Administered 2021-06-04 – 2021-06-26 (×23): 20 mg
  Filled 2021-06-03 (×23): qty 2

## 2021-06-03 MED ORDER — OSMOLITE 1.5 CAL PO LIQD
1000.0000 mL | ORAL | Status: DC
  Administered 2021-06-03 – 2021-06-08 (×6): 1000 mL
  Filled 2021-06-03 (×6): qty 1000

## 2021-06-03 MED ORDER — GERHARDT'S BUTT CREAM
TOPICAL_CREAM | Freq: Four times a day (QID) | CUTANEOUS | Status: DC
  Administered 2021-06-04 – 2021-06-12 (×4): 1 via TOPICAL
  Filled 2021-06-03 (×6): qty 1

## 2021-06-03 MED ORDER — FREE WATER
200.0000 mL | Freq: Four times a day (QID) | Status: DC
  Administered 2021-06-03 – 2021-06-28 (×97): 200 mL

## 2021-06-03 MED ORDER — OSMOLITE 1.2 CAL PO LIQD: 1000.0000 mL | ORAL | Status: DC

## 2021-06-03 MED ORDER — INSULIN ASPART PROT & ASPART (70-30 MIX) 100 UNIT/ML ~~LOC~~ SUSP
40.0000 [IU] | Freq: Two times a day (BID) | SUBCUTANEOUS | Status: DC
  Administered 2021-06-03 – 2021-06-12 (×18): 40 [IU] via SUBCUTANEOUS
  Filled 2021-06-03: qty 10

## 2021-06-03 MED ORDER — VITAMIN D 25 MCG (1000 UNIT) PO TABS
1000.0000 [IU] | ORAL_TABLET | Freq: Every day | ORAL | Status: DC
  Administered 2021-06-04 – 2021-06-26 (×23): 1000 [IU]
  Filled 2021-06-03 (×23): qty 1

## 2021-06-03 MED ORDER — INSULIN ASPART 100 UNIT/ML IJ SOLN
0.0000 [IU] | Freq: Three times a day (TID) | INTRAMUSCULAR | Status: DC
  Administered 2021-06-04 – 2021-06-05 (×4): 3 [IU] via SUBCUTANEOUS
  Administered 2021-06-05: 5 [IU] via SUBCUTANEOUS
  Administered 2021-06-05: 8 [IU] via SUBCUTANEOUS
  Administered 2021-06-06: 5 [IU] via SUBCUTANEOUS
  Administered 2021-06-06 – 2021-06-07 (×3): 3 [IU] via SUBCUTANEOUS
  Administered 2021-06-07: 2 [IU] via SUBCUTANEOUS
  Administered 2021-06-07 – 2021-06-08 (×2): 3 [IU] via SUBCUTANEOUS
  Administered 2021-06-08 (×2): 8 [IU] via SUBCUTANEOUS
  Administered 2021-06-09: 5 [IU] via SUBCUTANEOUS
  Administered 2021-06-11: 11 [IU] via SUBCUTANEOUS
  Administered 2021-06-11: 3 [IU] via SUBCUTANEOUS
  Administered 2021-06-11: 2 [IU] via SUBCUTANEOUS
  Administered 2021-06-12: 8 [IU] via SUBCUTANEOUS
  Administered 2021-06-12: 1 [IU] via SUBCUTANEOUS
  Administered 2021-06-13 (×2): 2 [IU] via SUBCUTANEOUS
  Administered 2021-06-13: 11 [IU] via SUBCUTANEOUS
  Administered 2021-06-14 (×2): 5 [IU] via SUBCUTANEOUS
  Administered 2021-06-15: 3 [IU] via SUBCUTANEOUS
  Administered 2021-06-15: 5 [IU] via SUBCUTANEOUS
  Administered 2021-06-16: 2 [IU] via SUBCUTANEOUS
  Administered 2021-06-17: 5 [IU] via SUBCUTANEOUS
  Administered 2021-06-17 – 2021-06-19 (×5): 3 [IU] via SUBCUTANEOUS
  Administered 2021-06-19: 2 [IU] via SUBCUTANEOUS
  Administered 2021-06-20: 3 [IU] via SUBCUTANEOUS
  Administered 2021-06-20: 8 [IU] via SUBCUTANEOUS
  Administered 2021-06-21: 3 [IU] via SUBCUTANEOUS
  Administered 2021-06-23 – 2021-06-24 (×3): 2 [IU] via SUBCUTANEOUS
  Administered 2021-06-25: 3 [IU] via SUBCUTANEOUS
  Administered 2021-06-26 – 2021-06-28 (×3): 2 [IU] via SUBCUTANEOUS
  Administered 2021-06-28 – 2021-06-29 (×2): 3 [IU] via SUBCUTANEOUS
  Administered 2021-06-29 – 2021-07-02 (×4): 2 [IU] via SUBCUTANEOUS
  Administered 2021-07-02: 3 [IU] via SUBCUTANEOUS
  Administered 2021-07-03 (×3): 2 [IU] via SUBCUTANEOUS
  Administered 2021-07-04: 3 [IU] via SUBCUTANEOUS

## 2021-06-03 MED ORDER — LEVOTHYROXINE SODIUM 75 MCG PO TABS
75.0000 ug | ORAL_TABLET | Freq: Every day | ORAL | Status: DC
  Administered 2021-06-04 – 2021-06-26 (×23): 75 ug
  Filled 2021-06-03 (×23): qty 1

## 2021-06-03 MED ORDER — NEPRO/CARBSTEADY PO LIQD: 45.0000 mL | ORAL | Status: DC

## 2021-06-03 MED ORDER — MODAFINIL 100 MG PO TABS
100.0000 mg | ORAL_TABLET | Freq: Every day | ORAL | Status: DC
  Administered 2021-06-04 – 2021-06-14 (×11): 100 mg
  Filled 2021-06-03 (×11): qty 1

## 2021-06-03 MED ORDER — APIXABAN 5 MG PO TABS
5.0000 mg | ORAL_TABLET | Freq: Two times a day (BID) | ORAL | Status: DC
  Administered 2021-06-03 – 2021-06-26 (×46): 5 mg
  Filled 2021-06-03 (×46): qty 1

## 2021-06-03 MED ORDER — FAMOTIDINE 20 MG PO TABS
20.0000 mg | ORAL_TABLET | Freq: Two times a day (BID) | ORAL | Status: DC
  Administered 2021-06-03 – 2021-06-26 (×45): 20 mg
  Filled 2021-06-03 (×46): qty 1

## 2021-06-03 MED ORDER — MELATONIN 3 MG PO TABS
3.0000 mg | ORAL_TABLET | Freq: Every day | ORAL | Status: DC
  Administered 2021-06-03 – 2021-06-25 (×23): 3 mg
  Filled 2021-06-03 (×23): qty 1

## 2021-06-03 MED ORDER — CALCIUM POLYCARBOPHIL 625 MG PO TABS
625.0000 mg | ORAL_TABLET | Freq: Every day | ORAL | Status: DC
  Administered 2021-06-03 – 2021-06-12 (×10): 625 mg via ORAL
  Filled 2021-06-03 (×10): qty 1

## 2021-06-03 MED ORDER — AMANTADINE HCL 100 MG PO CAPS
100.0000 mg | ORAL_CAPSULE | Freq: Every day | ORAL | Status: DC
  Administered 2021-06-04: 100 mg
  Filled 2021-06-03 (×2): qty 1

## 2021-06-03 MED ORDER — FREE WATER: 60.0000 mL | Status: DC

## 2021-06-03 MED ORDER — FLUTICASONE PROPIONATE 50 MCG/ACT NA SUSP
2.0000 | Freq: Two times a day (BID) | NASAL | Status: DC
  Administered 2021-06-03 – 2021-07-05 (×54): 2 via NASAL
  Filled 2021-06-03: qty 16
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
## 2021-06-03 NOTE — Progress Notes (Signed)
Inpatient Rehabilitation Medication Review by a Pharmacist  A complete drug regimen review was completed for this patient to identify any potential clinically significant medication issues.  Clinically significant medication issues were identified:  yes   Type of Medication Issue Identified Description of Issue Plan  Other  Cipro begun 6/24 for Pseudomonas pneumonia and was planned for 10 days (thru 06/05/21) Stay off Cipro.   Pharmacist comments:   Secure chat to Dr. Griselda Miner, since he had recently filed his note.  Cipro to stop on transfer to CIR per Dr. Riley Kill.  Time spent performing this drug regimen review (minutes):  20   Dennie Fetters, [place] 06/03/2021 7:05 PM
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
## 2021-06-03 NOTE — Progress Notes (Signed)
Inpatient Rehabilitation Care Coordinator Assessment and Plan Patient Details  Name: John Bryan MRN: [id number] Date of Birth: [birth year]  Today's Date: 06/03/2021  Hospital Problems: Active Problems:   TBI (traumatic brain injury) [facility])  Past Medical History:  Past Medical History:  Diagnosis Date   DM (diabetes mellitus) ([place])    HOH (hard of hearing)    HTN (hypertension)    Past Surgical History: History reviewed. No pertinent surgical history. Social History:  reports that he has quit smoking. His smoking use included cigarettes. He has never used smokeless tobacco. He reports previous alcohol use. He reports previous drug use.  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 pt hard of hearing. S/o John Bryan helped answer some questions for pt due to pt hearing or some confusion. Recent Psychosocial Issues: Denies Psychiatric History: Denies Substance Abuse History: Denies  Patient / Family Perceptions, Expectations & Goals Pt/Family understanding of illness & functional limitations: Pt and  family have a general understanding of pt care needs Premorbid pt/family roles/activities: Independent Anticipated changes in roles/activities/participation: Assistance with ADLs/IADLs Pt/family expectations/goals: Pt goal is "getting better,." Pt s/o John Bryan states she si more concerned about pt cognition and would like for him to be as close to how he was before, not so concerned about his ability to physically improve.  Community Resources Express Scripts: None Premorbid Home Care/DME Agencies: None Transportation available at discharge: TBD Resource referrals recommended: Neuropsychology  Discharge Planning Living Arrangements: Spouse/significant other Support Systems: Spouse/significant other, Children Type of Residence: Private residence Insurance Resources: [employer] Resources: [place], Other (Comment) (retirement/savings) Financial Screen Referred: No Living Expenses: Own Money Management: Patient Does the patient have any problems obtaining your medications?: No Home Management: S/o managed home care needs Patient/Family Preliminary Plans: Possible changes with who will assist with paying bills due to some cognitive defecits. Care Coordinator Barriers to Discharge: Decreased caregiver support, Lack of/limited family support Care Coordinator Anticipated Follow Up Needs: HH/OP  Clinical Impression  SW met with pt and pt s/o John Bryan in room to introduce self, explain role, and discuss discharge process. Pt does not have HCPOA forms, but dtr John Bryan [phone]) to make decisions. 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 Does not use any VA benefits. Pt has no DME, but has access to DME from s/o: RW, cane, and w/c (in storage). Reports pt dtr and husband will be moving here within 30 days as her husband is in [employer] and he has received medical leave to relocate to [place] to help assist.   SW called pt dtr John Bryan [phone]) to  introduce, self explain role, and discuss discharge process. Confirms that she will be moving here with her husband and family. They intend to be here around 7/19 or 7/20 since driving here will take 5 days. States that she is concerned  about him leaving before this date due to his s/o not being able to handle pt physically and/or cognitively with his changes right now. SW explained will share with medical team, and decisions are made based on progression in rehab. Will stress concerns. SW informed there will be f/u by co-workers on Tuesday/Wednesday to provide updates from team conference.   John Bryan A John Bryan 06/03/2021, 5:42 PM

## 2021-06-03 NOTE — Progress Notes (Signed)
Inpatient Rehabilitation  Patient information reviewed and entered into eRehab system by Suann Klier M. Delano Scardino, M.A., CCC/SLP, PPS Coordinator.  Information including medical coding, functional ability and quality indicators will be reviewed and updated through discharge.    

## 2021-06-04 DIAGNOSIS — S069X3S Unspecified intracranial injury with loss of consciousness of 1 hour to 5 hours 59 minutes, sequela: Secondary | ICD-10-CM | POA: Diagnosis not present

## 2021-06-04 DIAGNOSIS — E119 Type 2 diabetes mellitus without complications: Secondary | ICD-10-CM | POA: Diagnosis not present

## 2021-06-04 DIAGNOSIS — R1312 Dysphagia, oropharyngeal phase: Secondary | ICD-10-CM | POA: Diagnosis not present

## 2021-06-04 DIAGNOSIS — I10 Essential (primary) hypertension: Secondary | ICD-10-CM | POA: Diagnosis not present

## 2021-06-04 LAB — GLUCOSE, CAPILLARY
Glucose-Capillary: 162 mg/dL — ABNORMAL HIGH (ref 70–99)
Glucose-Capillary: 172 mg/dL — ABNORMAL HIGH (ref 70–99)
Glucose-Capillary: 197 mg/dL — ABNORMAL HIGH (ref 70–99)
Glucose-Capillary: 97 mg/dL (ref 70–99)
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
## 2021-06-04 NOTE — Plan of Care (Signed)
  Problem: RH Balance Goal: LTG Patient will maintain dynamic sitting balance (PT) Description: LTG:  Patient will maintain dynamic sitting balance with assistance during mobility activities (PT) Flowsheets (Taken 06/04/2021 1333) LTG: Pt will maintain dynamic sitting balance during mobility activities with:: Supervision/Verbal cueing Goal: LTG Patient will maintain dynamic standing balance (PT) Description: LTG:  Patient will maintain dynamic standing balance with assistance during mobility activities (PT) Flowsheets (Taken 06/04/2021 1333) LTG: Pt will maintain dynamic standing balance during mobility activities with:: Minimal Assistance - Patient > 75%   Problem: Sit to Stand Goal: LTG:  Patient will perform sit to stand with assistance level (PT) Description: LTG:  Patient will perform sit to stand with assistance level (PT) Flowsheets (Taken 06/04/2021 1333) LTG: PT will perform sit to stand in preparation for functional mobility with assistance level: Minimal Assistance - Patient > 75%   Problem: RH Bed Mobility Goal: LTG Patient will perform bed mobility with assist (PT) Description: LTG: Patient will perform bed mobility with assistance, with/without cues (PT). Flowsheets (Taken 06/04/2021 1333) LTG: Pt will perform bed mobility with assistance level of: Contact Guard/Touching assist   Problem: RH Bed to Chair Transfers Goal: LTG Patient will perform bed/chair transfers w/assist (PT) Description: LTG: Patient will perform bed to chair transfers with assistance (PT). Flowsheets (Taken 06/04/2021 1333) LTG: Pt will perform Bed to Chair Transfers with assistance level: Minimal Assistance - Patient > 75%   Problem: RH Car Transfers Goal: LTG Patient will perform car transfers with assist (PT) Description: LTG: Patient will perform car transfers with assistance (PT). Flowsheets (Taken 06/04/2021 1333) LTG: Pt will perform car transfers with assist:: Minimal Assistance - Patient > 75%    Problem: RH Ambulation Goal: LTG Patient will ambulate in controlled environment (PT) Description: LTG: Patient will ambulate in a controlled environment, # of feet with assistance (PT). Flowsheets (Taken 06/04/2021 1333) LTG: Pt will ambulate in controlled environ  assist needed:: Minimal Assistance - Patient > 75% LTG: Ambulation distance in controlled environment: 68ft with LRAD Goal: LTG Patient will ambulate in home environment (PT) Description: LTG: Patient will ambulate in home environment, # of feet with assistance (PT). Flowsheets (Taken 06/04/2021 1333) LTG: Pt will ambulate in home environ  assist needed:: Minimal Assistance - Patient > 75% LTG: Ambulation distance in home environment: 74ft with LRAD   Problem: RH Wheelchair Mobility Goal: LTG Patient will propel w/c in controlled environment (PT) Description: LTG: Patient will propel wheelchair in controlled environment, # of feet with assist (PT) Flowsheets (Taken 06/04/2021 1333) LTG: Pt will propel w/c in controlled environ  assist needed:: Supervision/Verbal cueing LTG: Propel w/c distance in controlled environment: 19ft Goal: LTG Patient will propel w/c in home environment (PT) Description: LTG: Patient will propel wheelchair in home environment, # of feet with assistance (PT). Flowsheets (Taken 06/04/2021 1333) LTG: Propel w/c distance in home environment: 64ft   Problem: RH Stairs Goal: LTG Patient will ambulate up and down stairs w/assist (PT) Description: LTG: Patient will ambulate up and down # of stairs with assistance (PT) Flowsheets (Taken 06/04/2021 1333) LTG: Pt will ambulate up/down stairs assist needed:: Minimal Assistance - Patient > 75% LTG: Pt will  ambulate up and down number of stairs: 5 steps with BUE support to access house

## 2021-06-04 NOTE — Discharge Instructions (Addendum)
Inpatient Rehab Discharge Instructions  John Bryan Discharge date and time: 07/05/21   Activities/Precautions/ Functional Status: Activity: no lifting, driving, or strenuous exercise till cleared by MD Diet:  soft foods.  Wound Care: keep wound clean and dry   Functional status:  ___ No restrictions     ___ Walk up steps independently _X__ 24/7 supervision/assistance  ___ Walk up steps with assistance ___ Intermittent supervision/assistance ___ Bathe/dress independently ___ Walk with walker    _X__ Bathe/dress with assistance ___ Walk Independently    ___ Shower independently ___ Walk with assistance    ___ Shower with assistance _X__ No alcohol     ___ Return to work/school ________  Special Instructions: Need to wear binder when out of bed. Put TEDs on in morning and remove at bedtime till dizziness resolves/you build up your strength.  Medications can be by mouth or by PEG.    COMMUNITY REFERRALS UPON DISCHARGE:    Home Health:   PT     OT     ST    RN    SNA                     Agency:[facility]  Phone: liaison- Elnita Maxwell #[phone]; [place]yline Mood #[phone] *Please expect follow-up within 2-3 days of discharge to discuss scheduling home visit. If you have not received follow-up, please contact the liaison [place] FIRST! Then contact the branch if no response.*  Medical Equipment/Items Ordered: rolling walker, wheelchair, tub transfer bench and 3in1 bedside commode                                                 Agency/Supplier:[facility] [phone]  My questions have been answered and I understand these instructions. I will adhere to these goals and the provided educational materials after my discharge from the hospital.  Patient/Caregiver Signature _______________________________ Date __________  Clinician Signature _______________________________________ Date __________  Please bring this form and your medication list with you to all  your follow-up doctor's appointments.    Information on my medicine - ELIQUIS (apixaban)  This medication education was reviewed with me or my healthcare representative as part of my discharge preparation.     Why was Eliquis prescribed for you? Eliquis was prescribed to treat blood clots that may have been found in the veins of your legs (deep vein thrombosis) or in your lungs (pulmonary embolism) and to reduce the risk of them occurring again.  What do You need to know about Eliquis ? The  dose is ONE 5 mg tablet taken TWICE daily.  Eliquis may be taken with or without food.   Try to take the dose about the same time in the morning and in the evening. If you have difficulty swallowing the tablet whole please discuss with your pharmacist how to take the medication safely.  Take Eliquis exactly as prescribed and DO NOT stop taking Eliquis without talking to the doctor who prescribed the medication.  Stopping may increase your risk of developing a new blood clot.  Refill your prescription before you run out.  After discharge, you should have regular check-up appointments with your healthcare provider that is prescribing your Eliquis.    What do you do if you miss a dose? If a dose of ELIQUIS is not taken at the scheduled time, take it as soon  as possible on the same day and twice-daily administration should be resumed. The dose should not be doubled to make up for a missed dose.  Important Safety Information A possible side effect of Eliquis is bleeding. You should call your healthcare provider right away if you experience any of the following: Bleeding from an injury or your nose that does not stop. Unusual colored urine (red or dark brown) or unusual colored stools (red or black). Unusual bruising for unknown reasons. A serious fall or if you hit your head (even if there is no bleeding).  Some medicines may interact with Eliquis and might increase your risk of bleeding or  clotting while on Eliquis. To help avoid this, consult your healthcare provider or pharmacist prior to using any new prescription or non-prescription medications, including herbals, vitamins, non-steroidal anti-inflammatory drugs (NSAIDs) and supplements.  This website has more information on Eliquis (apixaban): http://www.eliquis.com/eliquis/home  =====================================  Deep Vein Thrombosis    Deep vein thrombosis (DVT) is a condition in which a blood clot forms in a deep vein, such as a lower leg, thigh, or arm vein. A clot is blood that has thickened into a gel or solid. This condition is dangerous. It can lead to serious and even life-threatening complications if the clot travels to the lungs and causes a blockage (pulmonary embolism). It can also damage veins in the leg. This can result in leg pain, swelling, discoloration, and sores (post-thrombotic syndrome).  What are the causes? This condition may be caused by: A slowdown of blood flow. Damage to a vein. A condition that causes blood to clot more easily, such as an inherited clotting disorder.  What increases the risk? The following factors may make you more likely to develop this condition: Being overweight. Being older, especially over age 100. Sitting or lying down for more than four hours. Being in the hospital. Lack of physical activity (sedentary lifestyle). Pregnancy, being in childbirth, or having recently given birth. Taking medicines that contain estrogen, such as medicines to prevent pregnancy. Smoking. A history of any of the following: Blood clots or a blood clotting disease. Peripheral vascular disease. Inflammatory bowel disease. Cancer. Heart disease. Genetic conditions that affect how your blood clots, such as Factor V Leiden mutation. Neurological diseases that affect your legs (leg paresis). A recent injury, such as a car accident. Major or lengthy surgery. A central line placed inside  a large vein.  What are the signs or symptoms? Symptoms of this condition include: Swelling, pain, or tenderness in an arm or leg. Warmth, redness, or discoloration in an arm or leg. If the clot is in your leg, symptoms may be more noticeable or worse when you stand or walk. Some people may not develop any symptoms.  How is this diagnosed? This condition is diagnosed with: A medical history and physical exam. Tests, such as: Blood tests. These are done to check how well your blood clots. Ultrasound. This is done to check for clots. Venogram. For this test, contrast dye is injected into a vein and X-rays are taken to check for any clots  How is this treated? Treatment for this condition depends on: The cause of your DVT. Your risk for bleeding or developing more clots. Any other medical conditions that you have. Treatment may include: Taking a blood thinner (anticoagulant). This type of medicine prevents clots from forming. It may be taken by mouth, injected under the skin, or injected through an IV (catheter). Injecting clot-dissolving medicines into the affected vein (catheter-directed  thrombolysis). Having surgery. Surgery may be done to: Remove the clot. Place a filter in a large vein to catch blood clots before they reach the lungs. Some treatments may be continued for up to six months.  Follow these instructions at home: If you are taking blood thinners: Take the medicine exactly as told by your health care provider. Some blood thinners need to be taken at the same time every day. Do not skip a dose. Talk with your health care provider before you take any medicines that contain aspirin or NSAIDs. These medicines increase your risk for dangerous bleeding. Ask your health care provider about foods and drugs that could change the way the medicine works (may interact). Avoid those things if your health care provider tells you to do so. Blood thinners can cause easy bruising and may  make it difficult to stop bleeding. Because of this: Be very careful when using knives, scissors, or other sharp objects. Use an electric razor instead of a blade. Avoid activities that could cause injury or bruising, and follow instructions about how to prevent falls. Wear a medical alert bracelet or carry a card that lists what medicines you take.  General instructions Take over-the-counter and prescription medicines only as told by your health care provider. Return to your normal activities as told by your health care provider. Ask your health care provider what activities are safe for you. Wear compression stockings if recommended by your health care provider. Keep all follow-up visits as told by your health care provider. This is important.  How is this prevented? To lower your risk of developing this condition again: For 30 or more minutes every day, do an activity that: Involves moving your arms and legs. Increases your heart rate. When traveling for longer than four hours: Exercise your arms and legs every hour. Drink plenty of water. Avoid drinking alcohol. Avoid sitting or lying for a long time without moving your legs. If you have surgery or you are hospitalized, ask about ways to prevent blood clots. These may include taking frequent walks or using anticoagulants. Stay at a healthy weight. If you are a woman who is older than age 9, avoid unnecessary use of medicines that contain estrogen, such as some birth control pills. Do not use any products that contain nicotine or tobacco, such as cigarettes and e-cigarettes. This is especially important if you take estrogen medicines. If you need help quitting, ask your health care provider.  Contact a health care provider if: You miss a dose of your blood thinner. Your menstrual period is heavier than usual. You have unusual bruising.  Get help right away if: You have: New or increased pain, swelling, or redness in an arm or  leg. Numbness or tingling in an arm or leg. Shortness of breath. Chest pain. A rapid or irregular heartbeat. A severe headache or confusion. A cut that will not stop bleeding. There is blood in your vomit, stool, or urine. You have a serious fall or accident, or you hit your head. You feel light-headed or dizzy. You cough up blood.  These symptoms may represent a serious problem that is an emergency. Do not wait to see if the symptoms will go away. Get medical help right away. Call your local emergency services (911 in the [place] Do not drive yourself to the hospital. Summary Deep vein thrombosis (DVT) is a condition in which a blood clot forms in a deep vein, such as a lower leg, thigh, or arm vein. Symptoms  can include swelling, warmth, pain, and redness in your leg or arm. This condition may be treated with a blood thinner (anticoagulant medicine), medicine that is injected to dissolve blood clots,compression stockings, or surgery. If you are prescribed blood thinners, take them exactly as told. This information is not intended to replace advice given to you by your health care provider. Make sure you discuss any questions you have with your health care provider. Document Revised: 11/02/2017 Document Reviewed: 04/20/2017 Elsevier Patient Education  2020 [employer]
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
## 2021-06-05 DIAGNOSIS — I10 Essential (primary) hypertension: Secondary | ICD-10-CM | POA: Diagnosis not present

## 2021-06-05 DIAGNOSIS — E119 Type 2 diabetes mellitus without complications: Secondary | ICD-10-CM | POA: Diagnosis not present

## 2021-06-05 DIAGNOSIS — S069X3S Unspecified intracranial injury with loss of consciousness of 1 hour to 5 hours 59 minutes, sequela: Secondary | ICD-10-CM | POA: Diagnosis not present

## 2021-06-05 DIAGNOSIS — R1312 Dysphagia, oropharyngeal phase: Secondary | ICD-10-CM | POA: Diagnosis not present

## 2021-06-05 DIAGNOSIS — G44319 Acute post-traumatic headache, not intractable: Secondary | ICD-10-CM

## 2021-06-05 LAB — GLUCOSE, CAPILLARY
Glucose-Capillary: 131 mg/dL — ABNORMAL HIGH (ref 70–99)
Glucose-Capillary: 195 mg/dL — ABNORMAL HIGH (ref 70–99)
Glucose-Capillary: 224 mg/dL — ABNORMAL HIGH (ref 70–99)
Glucose-Capillary: 258 mg/dL — ABNORMAL HIGH (ref 70–99)

## 2021-06-05 MED ORDER — AMANTADINE HCL 50 MG/5ML PO SOLN
100.0000 mg | Freq: Every day | ORAL | Status: DC
  Administered 2021-06-05 – 2021-06-15 (×11): 100 mg
  Filled 2021-06-05 (×11): qty 10

## 2021-06-05 MED ORDER — TOPIRAMATE 25 MG PO TABS
25.0000 mg | ORAL_TABLET | Freq: Two times a day (BID) | ORAL | Status: DC
  Administered 2021-06-05 – 2021-06-06 (×3): 25 mg via ORAL
  Filled 2021-06-05 (×3): qty 1
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
## 2021-06-06 DIAGNOSIS — S069X0S Unspecified intracranial injury without loss of consciousness, sequela: Secondary | ICD-10-CM

## 2021-06-06 DIAGNOSIS — E119 Type 2 diabetes mellitus without complications: Secondary | ICD-10-CM | POA: Diagnosis not present

## 2021-06-06 DIAGNOSIS — R1312 Dysphagia, oropharyngeal phase: Secondary | ICD-10-CM | POA: Diagnosis not present

## 2021-06-06 LAB — COMPREHENSIVE METABOLIC PANEL
ALT: 22 U/L (ref 0–44)
AST: 32 U/L (ref 15–41)
Albumin: 2.3 g/dL — ABNORMAL LOW (ref 3.5–5.0)
Alkaline Phosphatase: 84 U/L (ref 38–126)
Anion gap: 8 (ref 5–15)
BUN: 17 mg/dL (ref 8–23)
CO2: 23 mmol/L (ref 22–32)
Calcium: 8.4 mg/dL — ABNORMAL LOW (ref 8.9–10.3)
Chloride: 102 mmol/L (ref 98–111)
Creatinine, Ser: 0.83 mg/dL (ref 0.61–1.24)
GFR, Estimated: >60 mL/min (ref >60)
Glucose, Bld: 209 mg/dL — ABNORMAL HIGH (ref 70–99)
Potassium: 4.2 mmol/L (ref 3.5–5.1)
Sodium: 133 mmol/L — ABNORMAL LOW (ref 135–145)
Total Bilirubin: 0.5 mg/dL (ref 0.3–1.2)
Total Protein: 5.7 g/dL — ABNORMAL LOW (ref 6.5–8.1)

## 2021-06-06 LAB — CBC WITH DIFFERENTIAL/PLATELET
Abs Immature Granulocytes: 0.1 K/uL — ABNORMAL HIGH (ref 0.00–0.07)
Basophils Absolute: 0.1 K/uL (ref 0.0–0.1)
Basophils Relative: 0 %
Eosinophils Absolute: 0.3 K/uL (ref 0.0–0.5)
Eosinophils Relative: 2 %
HCT: 39 % (ref 39.0–52.0)
Hemoglobin: 12.8 g/dL — ABNORMAL LOW (ref 13.0–17.0)
Immature Granulocytes: 1 %
Lymphocytes Relative: 13 %
Lymphs Abs: 1.9 K/uL (ref 0.7–4.0)
MCH: 30.5 pg (ref 26.0–34.0)
MCHC: 32.8 g/dL (ref 30.0–36.0)
MCV: 93.1 fL (ref 80.0–100.0)
Monocytes Absolute: 1.2 K/uL — ABNORMAL HIGH (ref 0.1–1.0)
Monocytes Relative: 9 %
Neutro Abs: 10.7 K/uL — ABNORMAL HIGH (ref 1.7–7.7)
Neutrophils Relative %: 75 %
Platelets: 260 K/uL (ref 150–400)
RBC: 4.19 MIL/uL — ABNORMAL LOW (ref 4.22–5.81)
RDW: 14.5 % (ref 11.5–15.5)
WBC: 14.2 K/uL — ABNORMAL HIGH (ref 4.0–10.5)
nRBC: 0 % (ref 0.0–0.2)

## 2021-06-06 LAB — GLUCOSE, CAPILLARY
Glucose-Capillary: 117 mg/dL — ABNORMAL HIGH (ref 70–99)
Glucose-Capillary: 143 mg/dL — ABNORMAL HIGH (ref 70–99)
Glucose-Capillary: 180 mg/dL — ABNORMAL HIGH (ref 70–99)
Glucose-Capillary: 186 mg/dL — ABNORMAL HIGH (ref 70–99)
Glucose-Capillary: 230 mg/dL — ABNORMAL HIGH (ref 70–99)

## 2021-06-06 MED ORDER — TOPIRAMATE 25 MG PO TABS
50.0000 mg | ORAL_TABLET | Freq: Two times a day (BID) | ORAL | Status: DC
  Administered 2021-06-06 – 2021-06-09 (×6): 50 mg via ORAL
  Filled 2021-06-06 (×6): qty 2

## 2021-06-06 NOTE — Progress Notes (Signed)
Inpatient Rehabilitation Center Individual Statement of Services  Patient Name:  John Bryan  Date:  06/06/2021  Welcome to the Inpatient Rehabilitation Center.  Our goal is to provide you with an individualized program based on your diagnosis and situation, designed to meet your specific needs.  With this comprehensive rehabilitation program, you will be expected to participate in at least 3 hours of rehabilitation therapies Monday-Friday, with modified therapy programming on the weekends.  Your rehabilitation program will include the following services:  Physical Therapy (PT), Occupational Therapy (OT), Speech Therapy (ST), 24 hour per day rehabilitation nursing, Neuropsychology, Care Coordinator, Rehabilitation Medicine, Nutrition Services, and Pharmacy Services  Weekly team conferences will be held on Tuesday to discuss your progress.  Your Inpatient Rehabilitation Care Coordinator will talk with you frequently to get your input and to update you on team discussions.  Team conferences with you and your family in attendance may also be held.  Expected length of stay: 21-25 days  Overall anticipated outcome: CGA-min level  Depending on your progress and recovery, your program may change. Your Inpatient Rehabilitation Care Coordinator will coordinate services and will keep you informed of any changes. Your Inpatient Rehabilitation Care Coordinator's name and contact numbers are listed  below.  The following services may also be recommended but are not provided by the Inpatient Rehabilitation Center:  Driving Evaluations Home Health Rehabiltiation Services Outpatient Rehabilitation Services    Arrangements will be made to provide these services after discharge if needed.  Arrangements include referral to agencies that provide these services.  Your insurance has been verified to be:  medicare A & B Your primary doctor is:  [facility]  Pertinent information will be  shared with your doctor and your insurance company.  Inpatient Rehabilitation Care Coordinator:  Susie Cassette [phone] or ([phone]  Information discussed with and copy given to patient by: Lucy Chris, 06/06/2021, 11:34 AM

## 2021-06-06 NOTE — Progress Notes (Signed)
Speech Language Pathology TBI Note  Patient Details  Name: TRANQUILINO FISCHLER MRN: [id number] Date of Birth: [birth year]  Today's Date: 06/06/2021 SLP Individual Time: 1000-1045 SLP Individual Time Calculation (min): 45 min  Short Term Goals: Week 1: SLP Short Term Goal 1 (Week 1): Patient will tolerate trials of upgraded solids without overt s/s aspiration or penetration at supervisionA level. SLP Short Term Goal 2 (Week 1): Patient will orient to time, place, situation using external aides and/or memory notebook, with modA cues. SLP Short Term Goal 3 (Week 1): Patient will maintain adequate attention to complete functional tasks for durations of 3-5 minutes with modA cues. SLP Short Term Goal 4 (Week 1): Patient will demonstrate awareness to errors when completing basic level problem solving tasks with modA cues. SLP Short Term Goal 5 (Week 1): Patient will answer delayed recall questions after SLP read aloud of 2-3 sentence information/story, with 80% accuracy and modA cues.  Skilled Therapeutic Interventions: Skilled ST intervention performed with focus on dysphagia and cognition. Patient complained of increased gas/pressure in esophagus which he states is constantly present with or without PO intake. May benefit from GI consult to further assess if symptoms don't resolve. Patient reports esophageal dilation 3-4 months ago. He also complained of increased nausea and upset stomach which he reports prevented him from consuming full liquid diet this a.m. The only thing he feels comfortable consuming at this time is water, which he was agreeable to consume during session. Patient tolerated successive straw sips of thin liquids (water) with timely swallow initiation and no overt s/s aspiration or penetration. Immediate belch noted x1. Patient reports increased belching due to "gas bubble" in esophagus. Educated on safe swallow precautions including small, single sips at a time, elevate HOB when  eating/drinking, and keeping HOB elevated following PO intake to promote digestion and reduce reflux. HOB was 46 degrees upon arrival, but reinforced education on keeping HOB >30 when actively receiving tube feeds via PEG. Patient was oriented to situation without cues, and benefited from Mod A verbal and visual environmental/contextual cues to orient to time and place. Provided cueing to increase awareness on using call bell for assistance. Patient was left in bed, all needs met, and bed alarm set. NT arrived at end of sessoin.  Pain Pain Assessment Pain Scale: 0-10 Pain Score: 0-No pain  Agitated Behavior Scale: TBI Observation Details Observation Environment: patient room Start of observation period - Date: 06/06/21 Start of observation period - Time: 1000 End of observation period - Date: 06/06/21 End of observation period - Time: 1045 Agitated Behavior Scale (DO NOT LEAVE BLANKS) Short attention span, easy distractibility, inability to concentrate: Present to a slight degree Impulsive, impatient, low tolerance for pain or frustration: Absent Uncooperative, resistant to care, demanding: Absent Violent and/or threatening violence toward people or property: Absent Explosive and/or unpredictable anger: Absent Rocking, rubbing, moaning, or other self-stimulating behavior: Absent Pulling at tubes, restraints, etc.: Absent Wandering from treatment areas: Absent Restlessness, pacing, excessive movement: Absent Repetitive behaviors, motor, and/or verbal: Absent Rapid, loud, or excessive talking: Absent Sudden changes of mood: Absent Easily initiated or excessive crying and/or laughter: Absent Self-abusiveness, physical and/or verbal: Absent Agitated behavior scale total score: 15  Therapy/Group: Individual Therapy  Patty Sermons 06/06/2021, 4:04 PM

## 2021-06-06 NOTE — Plan of Care (Addendum)
Behavioral Plan: John Bryan   [place] Level: VI   Behavior to decrease/ eliminate:  -low frustration tolerance -Maximize participation in therapy sessions and independence  - impulsivity with mobility   Changes to environment: -Promote proper sleep/wake cycle- lights on during the day, off at night -liquids ok at bedside  Interventions: -Bed alarm on -HOB at >30 degrees for eating/drinking  Recommendations for interactions with patient: - Benefits from clear mask d/t HOH - encourage OOB every session to improve confidence during mobility  - keep walker without of reach pt verbalizing impulsive thoughts of mobility - gentle reorientation for confabulation -education about confabulation   Attendees:  [name], PT Eilene Ghazi, SLP Jake Shark, OT Blanch Media, OT
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
## 2021-06-06 NOTE — Progress Notes (Signed)
Physical Therapy TBI Note  Patient Details  Name: John Bryan MRN: [id number] Date of Birth: 06-May-1951  Today's Date: 06/06/2021 PT Individual Time: 1100-1145 PT Individual Time Calculation (min): 45 min   Short Term Goals: Week 1:  PT Short Term Goal 1 (Week 1): Pt will transfer with mod assist  and LRAD to WC PT Short Term Goal 2 (Week 1): Pt will tolerate sitting in WC>2 hours between therapies PT Short Term Goal 3 (Week 1): Pt will ambulate 63ft with mod assist and LRAD  Skilled Therapeutic Interventions/Progress Updates:    Pt initially receiving nursing care but somewhat agreeable to attempting OOB.  Therapist obtained Antony Salmon. Pt rolled L/R w/max cues for sequencing, mod assist. Side to sit w/multimodal cues for sequencing, performs w/mod assist of 1, encouragement for maximizing effort/independence w/task. (Pt stated "someone has to pull me up"). In sitting pt c/o dizzyness and nausea.  Noted significant nystagmus, poor gaze stabilization, poor ocular alignment. Sit to stand in [place] from elevated bed w/+2 min assist.  Stands upright but again c/o feeling "unstable, dizzy". Transfers to wc, stand to sit from [place] w/moderate multimodal cueing and assist to control descent to lower surface. Pt sat in wc x 10 min, encouraged pt to attempt Sit to stand using RW but pt declines stating "I need to get back in the bed.  I think I am going to throw up." Sit to stand from wc in [place] - fails to adequately wt shift or utilize momentum w/first attempt. Pt reinstructed then performs Sit to stand from wc to stedy w/mod assist of 1.  Achieves full upright posture in [place]. Transported to bed perched in [place]. Sit to stand to sit on edge of bed w/min assist.   Pt then transitions to sidelying. W/supervision and max cues for safety/sequencing, pt rushing due to nausea.  Pt required mod assist and step by step cueing to reposition in bed.  Pt c/o increased R sided headache pain and nausea.  Pt  provided w/call bell and calls nurse to request meds.  Pt unable to hear response clearly and therapist "interpreted" response of secretary indicating she would notify his nurse. Pt left supine w/rails up x 34 alarm set, bed in lowest position, and needs in reach. Pt had difficulty w/communication due to [facility] and would benefit from therapist using clear mask.    Therapy Documentation Precautions:  Precautions Precautions: Fall Precaution Comments: G-tube, fall, L 5th digit WBAT, sinus precautions Restrictions Weight Bearing Restrictions: Yes LUE Weight Bearing: Weight bearing as tolerated   Pain: Pain Assessment Pain Scale: 0-10 Pain Score: 0-No pain Faces Pain Scale: No hurt Pain Type: Acute pain Pain Location: Head Pain Descriptors / Indicators: Aching Pain Frequency: Constant Pain Onset: On-going Patients Stated Pain Goal: 2 Pain Intervention(s): Medication (See eMAR) Agitated Behavior Scale: TBI Observation Details Observation Environment: pt room Start of observation period - Date: 06/06/21 Start of observation period - Time: 1100 End of observation period - Date: 06/06/21 End of observation period - Time: 1145 Agitated Behavior Scale (DO NOT LEAVE BLANKS) Short attention span, easy distractibility, inability to concentrate: Present to a slight degree Impulsive, impatient, low tolerance for pain or frustration: Absent Uncooperative, resistant to care, demanding: Absent Violent and/or threatening violence toward people or property: Absent Explosive and/or unpredictable anger: Absent Rocking, rubbing, moaning, or other self-stimulating behavior: Absent Pulling at tubes, restraints, etc.: Absent Wandering from treatment areas: Absent Restlessness, pacing, excessive movement: Absent Repetitive behaviors, motor, and/or verbal: Absent Rapid, loud, or  excessive talking: Absent Sudden changes of mood: Absent Easily initiated or excessive crying and/or laughter:  Absent Self-abusiveness, physical and/or verbal: Absent Agitated behavior scale total score: 15    Therapy/Group: Individual Therapy [name], PT   [name] 06/06/2021, 12:11 PM

## 2021-06-06 NOTE — Progress Notes (Signed)
Occupational Therapy TBI Note  Patient Details  Name: John Bryan MRN: [id number] Date of Birth: Jun 20, 1951  Today's Date: 06/06/2021 OT Individual Time: 2902-1115 OT Individual Time Calculation (min): 60 min   Session 2: OT Individual Time: 1420-1500 OT Individual Time Calculation (min): 40 min    Short Term Goals: Week 1:  OT Short Term Goal 1 (Week 1): Pt will tolerate EOB ADLs with no supine rest breaks to increase functional activity tolerance OT Short Term Goal 2 (Week 1): Pt will don pants with max A OT Short Term Goal 3 (Week 1): Pt will complete sit > stand for toileting tasks with max A OT Short Term Goal 4 (Week 1): Pt will demonstrate improved intellectual awareness of deficits with mod cueing  Skilled Therapeutic Interventions/Progress Updates:    Pt received supine with 5/10 HA, reporting frustration with nighttime medication administration and lack of sleep. Pt demonstrated improved emergent awareness, both remembering and processing through confusion he had on Saturday. TBI education provided re RLAS VI, confusion, and awareness deficits. He is somewhat self limiting but always agreeable to OT suggestions re activity. Pt also reporting nausea at rest and with activity from beginning to eat. L eye nystagmus present intermittently. Pt completed bed mobility to EOB with mod lifting A at his trunk and mod instructional cueing for technique. Pt completed UB bathing with set up assist, min A to don/doff shirt. Pt requires frequent rest breaks. He used the RW to stand from elevated EOB- requiring OT stabilization while he put both UE on the RW to stand- requiring mod A. Pt was able to stand with min A while OT pulled up incontinence brief with max A. Pt required prolonged seated rest break. Vitals obtained- BP 153/78, HR 98 bpm. Pt completed sit > stand again with similar assist and was able to complete 4x lateral steps to the [facility] with only min A. Provided copious words of  encouragement re progress already from eval on Saturday. Pt returned to supine in bed and completed bed mobility, rolling R and L with CGA while OT changed bed linens. Pt was left supine with all needs met, telesitter on, bed alarm on. G tube running.   Session 2: Pt received supine, confused on time of day, stating "you're here early this morning". He did remember random parts of our conversation from this morning's session. Pt's PEG running with bed at 24 degrees. Ensured 30 degree lock on bed at close of session. Pt reported incontinence. Cueing provided to increase awareness re need to call for assist after incontinence. Pt completed bed mobility, bridging with min A and rolling R and L with CGA for max A brief change and hygiene. Pt reporting nausea and "room spinning" with rolling, obvious L nystagmus present. Pt transferred to EOB with max A. He reported heavy fatigue and nausea. Visual assessment completed. Pt's R eye is unable to track laterally- question lateral rectus paralysis. Convergence normal. 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 Pt requested to return to supine and was left in sidelying ,all needs met, bed alarm set.   Therapy Documentation Precautions:  Precautions Precautions: Fall Precaution Comments: G-tube, fall, L 5th digit WBAT, sinus precautions Restrictions Weight Bearing Restrictions: Yes LUE Weight Bearing: Weight bearing as tolerated  Agitated Behavior Scale: TBI Observation Details Observation Environment: Pt room Start of observation period -  Date: 06/06/21 Start of observation period - Time: 0730 End of observation period - Date: 06/06/21 End of observation period - Time: 0830 Agitated Behavior Scale (DO NOT LEAVE BLANKS) Short attention span, easy distractibility, inability to concentrate: Present to a slight  degree Impulsive, impatient, low tolerance for pain or frustration: Absent Uncooperative, resistant to care, demanding: Absent Violent and/or threatening violence toward people or property: Absent Explosive and/or unpredictable anger: Absent Rocking, rubbing, moaning, or other self-stimulating behavior: Absent Pulling at tubes, restraints, etc.: Absent Wandering from treatment areas: Absent Restlessness, pacing, excessive movement: Absent Repetitive behaviors, motor, and/or verbal: Absent Rapid, loud, or excessive talking: Absent Sudden changes of mood: Absent Easily initiated or excessive crying and/or laughter: Absent Self-abusiveness, physical and/or verbal: Absent Agitated behavior scale total score: 15   Therapy/Group: Individual Therapy  Curtis Sites 06/06/2021, 8:57 AM

## 2021-06-07 ENCOUNTER — Inpatient Hospital Stay (HOSPITAL_COMMUNITY): Payer: No Typology Code available for payment source

## 2021-06-07 DIAGNOSIS — I10 Essential (primary) hypertension: Secondary | ICD-10-CM | POA: Diagnosis not present

## 2021-06-07 DIAGNOSIS — K224 Dyskinesia of esophagus: Secondary | ICD-10-CM

## 2021-06-07 DIAGNOSIS — S069X3S Unspecified intracranial injury with loss of consciousness of 1 hour to 5 hours 59 minutes, sequela: Secondary | ICD-10-CM | POA: Diagnosis not present

## 2021-06-07 DIAGNOSIS — E119 Type 2 diabetes mellitus without complications: Secondary | ICD-10-CM | POA: Diagnosis not present

## 2021-06-07 DIAGNOSIS — R1312 Dysphagia, oropharyngeal phase: Secondary | ICD-10-CM | POA: Diagnosis not present

## 2021-06-07 LAB — GLUCOSE, CAPILLARY
Glucose-Capillary: 172 mg/dL — ABNORMAL HIGH (ref 70–99)
Glucose-Capillary: 167 mg/dL — ABNORMAL HIGH (ref 70–99)

## 2021-06-07 NOTE — Progress Notes (Signed)
Occupational Therapy TBI Note  Patient Details  Name: John Bryan MRN: [id number] Date of Birth: 24-Aug-1951  Today's Date: 06/07/2021 OT Individual Time: 1030-1120 OT Individual Time Calculation (min): 50 min  and Today's Date: 06/07/2021 OT Missed Time: 10 Minutes Missed Time Reason: Patient fatigue (also c/o increased nausea)   Short Term Goals: Week 1:  OT Short Term Goal 1 (Week 1): Pt will tolerate EOB ADLs with no supine rest breaks to increase functional activity tolerance OT Short Term Goal 2 (Week 1): Pt will don pants with max A OT Short Term Goal 3 (Week 1): Pt will complete sit > stand for toileting tasks with max A OT Short Term Goal 4 (Week 1): Pt will demonstrate improved intellectual awareness of deficits with mod cueing   Skilled Therapeutic Interventions/Progress Updates:    Pt received in room and consented to OT tx. Pt in bed with no pants on, encouraged to don pants but pt becomes easily agitated with instruction. When asked to sit EOB, pt reports that he can't do that well. Therapist reminded pt that she will help if needed , educated on importance of movement and getting up and OOB. Once EOB, therapist threaded pt's pants, but pt refused to even attempt STS to hike, becoming more agitated with encouragement. Pt laid back down in bed and instructed to roll and hike, pt req max A to hike pants all the way over hips. Pt then reported he needed to have a BM. Pants doffed with total A and pt refused to try to get to [facility], so bedpan placed. Pt req multiple cues for rolling properly side to side. Pt then cleaned up, only smear BM and gas. As pt was unwilling to get OOB, instructed in bed level BUE strengthening HEP to increase strength and activity tolerance for ADLs and functional transfers. Pt instructed in 3# dowel rod shoulder press for 3x10 with min cuing for proper tech with good carryover. Pt then requested to use bedpan again, but unsuccessful BM. Pt then reported he had  had enough, that his previous session with the standing always makes him dizzy and nauseas. After tx, pt left in bed with alarm on and all needs met, Telesitter present.   Therapy Documentation Precautions:  Precautions Precautions: Fall Precaution Comments: G-tube, fall, L 5th digit WBAT, sinus precautions Restrictions Weight Bearing Restrictions: Yes LUE Weight Bearing: Weight bearing as tolerated  Pain:  Pain Assessment Pain Scale: 0-10 Pain Score: 3  Faces Pain Scale: No hurt Pain Type: Acute pain Pain Location: Head Pain Descriptors / Indicators: Throbbing Pain Frequency: Constant Pain Onset: On-going Patients Stated Pain Goal: 1 Agitated Behavior Scale: TBI Observation Details Observation Environment: Patient's room Start of observation period - Date: 06/07/21 Start of observation period - Time: 0845 End of observation period - Date: 06/07/21 End of observation period - Time: 0930 Agitated Behavior Scale (DO NOT LEAVE BLANKS) Short attention span, easy distractibility, inability to concentrate: Present to a slight degree Impulsive, impatient, low tolerance for pain or frustration: Present to a slight degree Uncooperative, resistant to care, demanding: Absent Violent and/or threatening violence toward people or property: Absent Explosive and/or unpredictable anger: Absent Rocking, rubbing, moaning, or other self-stimulating behavior: Absent Pulling at tubes, restraints, etc.: Absent Wandering from treatment areas: Absent Restlessness, pacing, excessive movement: Absent Repetitive behaviors, motor, and/or verbal: Absent Rapid, loud, or excessive talking: Absent Sudden changes of mood: Present to a slight degree Easily initiated or excessive crying and/or laughter: Absent Self-abusiveness, physical and/or verbal:  Absent Agitated behavior scale total score: 17    Therapy/Group: Individual Therapy  Biridiana Twardowski 06/07/2021, 12:14 PM
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
## 2021-06-07 NOTE — Progress Notes (Signed)
Physical Therapy TBI Note  Patient Details  Name: John Bryan MRN: [id number] Date of Birth: [birth year]  Today's Date: 06/07/2021 PT Individual Time: 0802-0829 PT Individual Time Calculation (min): 27 min   Short Term Goals: Week 1:  PT Short Term Goal 1 (Week 1): Pt will transfer with mod assist  and LRAD to WC PT Short Term Goal 2 (Week 1): Pt will tolerate sitting in WC>2 hours between therapies PT Short Term Goal 3 (Week 1): Pt will ambulate 69ft with mod assist and LRAD  Skilled Therapeutic Interventions/Progress Updates:     PT received supine in bed and agrees to therapy. Reports headache pain. Number not provided. PT provides rest breaks as needed and RN present to provide pain meds via PEG. Pt performs bilateral rolling with minA to don shorts with PT providing totalA for dressing. Pt performs supine to sit with modA. Upon sitting EOB, pt verbalizes dizziness and nausea, that does not resolve with increased time. PT assesses BP and in sitting it is 138/68. Pt agreeable to attempt standing and able to stand to RW with minA and cues for hand placement and body mechanics. Pt verbalizes increase nausea and requests to lay down, but upon return to sidelying pt requesting to sit back up to use urinal. PT encourages pt to stand again with RW for urination. Pt able to stand with minA and PT holds urinal while pt voids. Pt takes sidesteps toward [facility] x2'. Sit to supine with modA. Pt left supine with alarm intact and all needs within reach.  Therapy Documentation Precautions:  Precautions Precautions: Fall Precaution Comments: G-tube, fall, L 5th digit WBAT, sinus precautions Restrictions Weight Bearing Restrictions: Yes LUE Weight Bearing: Weight bearing as tolerated  Agitated Behavior Scale: TBI   Agitated Behavior Scale (DO NOT LEAVE BLANKS) Short attention span, easy distractibility, inability to concentrate: Present to a slight degree Impulsive, impatient, low tolerance for  pain or frustration: Present to a slight degree Uncooperative, resistant to care, demanding: Absent Violent and/or threatening violence toward people or property: Absent Explosive and/or unpredictable anger: Absent Rocking, rubbing, moaning, or other self-stimulating behavior: Absent Pulling at tubes, restraints, etc.: Absent Wandering from treatment areas: Absent Restlessness, pacing, excessive movement: Absent Repetitive behaviors, motor, and/or verbal: Absent Rapid, loud, or excessive talking: Absent Sudden changes of mood: Present to a slight degree Easily initiated or excessive crying and/or laughter: Absent Self-abusiveness, physical and/or verbal: Absent Agitated behavior scale total score: 17   Therapy/Group: Individual Therapy  Beau Fanny, PT, DPT 06/07/2021, 3:58 PM
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
## 2021-06-07 NOTE — Progress Notes (Addendum)
Team Conference Report to Patient/Family  Team Conference discussion was reviewed with the patient and caregiver, including goals, any changes in plan of care and target discharge date.  Patient and caregiver express understanding and are in agreement.  The patient has a target discharge date of  (3-4 weeks).  SW met with pt, and S.O. provided conference updates. Discharge date will be determined. S.O concerned about pt's eyes crossing, reports a lot of wax and blood build up in ears and pt has a hearing aid located in top drawer. Pt daughter and S.O report pt did not eat much previously. Pt ate mostly vegetables. Daughter requesting call from [place].    John Bryan 06/07/2021, 3:06 PM P

## 2021-06-07 NOTE — Progress Notes (Signed)
Speech Language Pathology TBI Note  Patient Details  Name: John Bryan MRN: [id number] Date of Birth: 04-03-1951  Today's Date: 06/07/2021 SLP Individual Time: 5366-4403 SLP Individual Time Calculation (min): 40 min  Short Term Goals: Week 1: SLP Short Term Goal 1 (Week 1): Patient will tolerate trials of upgraded solids without overt s/s aspiration or penetration at supervisionA level. SLP Short Term Goal 2 (Week 1): Patient will orient to time, place, situation using external aides and/or memory notebook, with modA cues. SLP Short Term Goal 3 (Week 1): Patient will maintain adequate attention to complete functional tasks for durations of 3-5 minutes with modA cues. SLP Short Term Goal 4 (Week 1): Patient will demonstrate awareness to errors when completing basic level problem solving tasks with modA cues. SLP Short Term Goal 5 (Week 1): Patient will answer delayed recall questions after SLP read aloud of 2-3 sentence information/story, with 80% accuracy and modA cues.  Skilled Therapeutic Interventions: Skilled treatment session focused on cognitive and dysphagia goals. Patient declined tray or snack of Dys. 1 textures or thin liquids but was agreeable to ice chips. Patient consumed minimal amounts without overt s/s of aspiration. Patient is extremely hard of hearing and requires multiple repetitions of information. Patient also tends to infer what he thinks he hears and it appears to resemble language of confusion. SLP provided external aids for orientation to place and date which he used with supervision verbal cues. Patient was independently oriented to situation. Patient with decreased frustration tolerance throughout session. Patient left upright in bed with alarm on and all needs within reach. Continue with current plan of care.      Pain Pain Assessment Pain Scale: 0-10 Pain Score: 3  Faces Pain Scale: No hurt Pain Type: Acute pain Pain Location: Head Pain Descriptors /  Indicators: Throbbing Pain Frequency: Constant Pain Onset: On-going Patients Stated Pain Goal: 1  Agitated Behavior Scale: TBI Observation Details Observation Environment: Patient's room Start of observation period - Date: 06/07/21 Start of observation period - Time: 0845 End of observation period - Date: 06/07/21 End of observation period - Time: 0930 Agitated Behavior Scale (DO NOT LEAVE BLANKS) Short attention span, easy distractibility, inability to concentrate: Present to a slight degree Impulsive, impatient, low tolerance for pain or frustration: Absent Uncooperative, resistant to care, demanding: Absent Violent and/or threatening violence toward people or property: Absent Explosive and/or unpredictable anger: Absent Rocking, rubbing, moaning, or other self-stimulating behavior: Absent Pulling at tubes, restraints, etc.: Absent Wandering from treatment areas: Absent Restlessness, pacing, excessive movement: Absent Repetitive behaviors, motor, and/or verbal: Absent Rapid, loud, or excessive talking: Absent Sudden changes of mood: Absent Easily initiated or excessive crying and/or laughter: Absent Self-abusiveness, physical and/or verbal: Absent Agitated behavior scale total score: 15  Therapy/Group: Individual Therapy  John Bryan 06/07/2021, 10:19 AM

## 2021-06-08 LAB — GLUCOSE, CAPILLARY
Glucose-Capillary: 116 mg/dL — ABNORMAL HIGH (ref 70–99)
Glucose-Capillary: 132 mg/dL — ABNORMAL HIGH (ref 70–99)
Glucose-Capillary: 169 mg/dL — ABNORMAL HIGH (ref 70–99)
Glucose-Capillary: 267 mg/dL — ABNORMAL HIGH (ref 70–99)
Glucose-Capillary: 284 mg/dL — ABNORMAL HIGH (ref 70–99)

## 2021-06-08 MED ORDER — OSMOLITE 1.5 CAL PO LIQD
355.0000 mL | Freq: Three times a day (TID) | ORAL | Status: DC
  Administered 2021-06-08: 237 mL
  Administered 2021-06-08 – 2021-06-09 (×2): 355 mL
  Filled 2021-06-08: qty 474

## 2021-06-08 NOTE — Progress Notes (Signed)
Occupational Therapy TBI Note  Patient Details  Name: John Bryan MRN: [id number] Date of Birth: 01/21/1951  Today's Date: 06/08/2021 OT Individual Time: 4098-1191 OT Individual Time Calculation (min): 70 min    Short Term Goals: Week 1:  OT Short Term Goal 1 (Week 1): Pt will tolerate EOB ADLs with no supine rest breaks to increase functional activity tolerance OT Short Term Goal 2 (Week 1): Pt will don pants with max A OT Short Term Goal 3 (Week 1): Pt will complete sit > stand for toileting tasks with max A OT Short Term Goal 4 (Week 1): Pt will demonstrate improved intellectual awareness of deficits with mod cueing  Skilled Therapeutic Interventions/Progress Updates:    Pt received in bed and stated he had to use the bathroom.  Bathroom had to be set up as no BSC over toilet in bathroom (pt is very tall) and soiled equipment needed to be cleaned.  Pt c/o vision difficulties so applied eyepatch over L eye which is blurrier and pt able to see better. Rolled to L with mod A and sat to EOB with max.  Set pt up with stedy lift to use the bathroom.   Pt put in a small attempt and then yelled "I cant do it!"  He got more frustrated with encouragement so gave him a minute and told him he had to stand or he would go in his pants on the bed.  With max of 1 pt stood in stedy and transferred to toilet, mod to lower safely.   From toilet, did UB self care with mod A overall.  Pt getting frustrated saying he was cold.  When I tried to get him to wash off the soap on his body he got mad and threw the washcloth onto the floor.  "I am done"    Pt was able to void loose BM but then put in no effort to try to stand. Had to get his RN to assist.  Pt rose to stand with max to total A of 2.  Pt stood in stedy with CGA for cleansing and pulling up of pants.     Transferred to EOB and pt moved to supine.  He then got nauseated and vomited a small amount.  After pt was calm and relaxed he had another  loose stool in bed.  His shorts got soiled. RN assisted this OT with cleansing pt and changing briefs.  Pt cued to bridge his hips which he was able to do slightly.  Pt adjusted in bed with all needs met. Bed alarm set.    Therapy Documentation Precautions:  Precautions Precautions: Fall Precaution Comments: G-tube, fall, L 5th digit WBAT, sinus precautions Restrictions Weight Bearing Restrictions: Yes LUE Weight Bearing: Weight bearing as tolerated  Pain: Pain Assessment Pain Scale: 0-10 Pain Score: 0-No pain Faces Pain Scale: No hurt Pain Type: Acute pain Pain Location: Head  Agitated Behavior Scale: TBI  (Note: The ABS from this OT session was 22 but did not pull into this note- pt did have frustration, change of mood) Observation Details Observation Environment: patient's room Start of observation period - Date: 06/08/21 Start of observation period - Time: 1100 End of observation period - Date: 06/08/21 End of observation period - Time: 1115 Agitated Behavior Scale (DO NOT LEAVE BLANKS) Short attention span, easy distractibility, inability to concentrate: Present to a slight degree Impulsive, impatient, low tolerance for pain or frustration: Present to a slight degree Uncooperative, resistant to care, demanding:  Present to a slight degree Violent and/or threatening violence toward people or property: Absent Explosive and/or unpredictable anger: Absent Rocking, rubbing, moaning, or other self-stimulating behavior: Absent Pulling at tubes, restraints, etc.: Absent Wandering from treatment areas: Absent Restlessness, pacing, excessive movement: Absent Repetitive behaviors, motor, and/or verbal: Absent Rapid, loud, or excessive talking: Absent Sudden changes of mood: Absent Easily initiated or excessive crying and/or laughter: Absent Self-abusiveness, physical and/or verbal: Absent Agitated behavior scale total score: 17  ADL: ADL Eating: NPO Grooming: Contact  guard Where Assessed-Grooming: Edge of bed Upper Body Bathing: Minimal assistance Where Assessed-Upper Body Bathing: Edge of bed Lower Body Bathing: Maximal assistance Where Assessed-Lower Body Bathing: Bed level Upper Body Dressing: Minimal assistance Where Assessed-Upper Body Dressing: Edge of bed Lower Body Dressing: Dependent Where Assessed-Lower Body Dressing: Bed level Toileting: Dependent Where Assessed-Toileting: Bed level Toilet Transfer: Unable to assess Tub/Shower Transfer: Unable to assess   Therapy/Group: Individual Therapy  [place] 06/08/2021, 1:12 PM
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
## 2021-06-08 NOTE — Progress Notes (Signed)
Physical Therapy TBI Note  Patient Details  Name: John Bryan MRN: [id number] Date of Birth: 11/12/1951  Today's Date: 06/08/2021 PT Individual Time: 1100-1115 PT Individual Time Calculation (min): 15 min   Short Term Goals: Week 1:  PT Short Term Goal 1 (Week 1): Pt will transfer with mod assist  and LRAD to WC PT Short Term Goal 2 (Week 1): Pt will tolerate sitting in WC>2 hours between therapies PT Short Term Goal 3 (Week 1): Pt will ambulate 19ft with mod assist and LRAD  Skilled Therapeutic Interventions/Progress Updates:    Pt greeted supine in bed, sleeping on arrival, awakens to voice. Pt is hard of hearing. Upon introduction, pt rolling to face the opposite direction and reports "i'm not getting up, I don't feel well." Pt reports stomach discomfort and a poor nights rest due to frequent diarrhea (pt on C-DIFF) precautions. Pt reports he got up this morning with OT and was able to recall some events from this session. He requests to remain in bed. Offered to assist with sitting EOB and participate in other therapeutic tasks but he continued to refuse. Spent some time educating on benefits of mobilization and participating with therapy, pt continued to pleasantly refuse. He missed 45 minutes of skilled therapy due to refusal.   Therapy Documentation Precautions:  Precautions Precautions: Fall Precaution Comments: G-tube, fall, L 5th digit WBAT, sinus precautions Restrictions Weight Bearing Restrictions: Yes LUE Weight Bearing: Weight bearing as tolerated General: PT Amount of Missed Time (min): 45 Minutes PT Missed Treatment Reason: Patient ill (Comment);Patient unwilling to participate (diarrhea)  Agitated Behavior Scale: TBI Observation Details Observation Environment: patient's room Start of observation period - Date: 06/08/21 Start of observation period - Time: 1100 End of observation period - Date: 06/08/21 End of observation period - Time: 1115 Agitated Behavior  Scale (DO NOT LEAVE BLANKS) Short attention span, easy distractibility, inability to concentrate: Present to a slight degree Impulsive, impatient, low tolerance for pain or frustration: Present to a slight degree Uncooperative, resistant to care, demanding: Present to a slight degree Violent and/or threatening violence toward people or property: Absent Explosive and/or unpredictable anger: Absent Rocking, rubbing, moaning, or other self-stimulating behavior: Absent Pulling at tubes, restraints, etc.: Absent Wandering from treatment areas: Absent Restlessness, pacing, excessive movement: Absent Repetitive behaviors, motor, and/or verbal: Absent Rapid, loud, or excessive talking: Absent Sudden changes of mood: Absent Easily initiated or excessive crying and/or laughter: Absent Self-abusiveness, physical and/or verbal: Absent Agitated behavior scale total score: 17     Therapy/Group: Individual Therapy  Wannetta Langland P Embry Huss 06/08/2021, 11:22 AM

## 2021-06-08 NOTE — Progress Notes (Signed)
Physical Therapy Note  Patient Details  Name: John Bryan MRN: [id number] Date of Birth: [birth year] Today's Date: 06/08/2021    Patient asleep in the bed upon PT arrival. Patient slowly aroused to tactile stimulation and politely refused therapy due to continued nausea and fatigue. Patient missed 30 min of skilled PT due to nausea/fatigue, RN made aware. Will attempt to make-up missed time as able.     [name] L [name] PT, DPT  06/07/2021, 4:05 AM
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
## 2021-06-08 NOTE — Progress Notes (Signed)
Speech Language Pathology TBI Note  Patient Details  Name: John Bryan MRN: [id number] Date of Birth: 07/31/1951  Today's Date: 06/08/2021 SLP Individual Time: 1335-1350 SLP Individual Time Calculation (min): 15 min and Today's Date: 06/08/2021 SLP Missed Time: 10 Minutes Missed Time Reason: Patient unwilling to participate  Short Term Goals: Week 1: SLP Short Term Goal 1 (Week 1): Patient will tolerate trials of upgraded solids without overt s/s aspiration or penetration at supervisionA level. SLP Short Term Goal 2 (Week 1): Patient will orient to time, place, situation using external aides and/or memory notebook, with modA cues. SLP Short Term Goal 3 (Week 1): Patient will maintain adequate attention to complete functional tasks for durations of 3-5 minutes with modA cues. SLP Short Term Goal 4 (Week 1): Patient will demonstrate awareness to errors when completing basic level problem solving tasks with modA cues. SLP Short Term Goal 5 (Week 1): Patient will answer delayed recall questions after SLP read aloud of 2-3 sentence information/story, with 80% accuracy and modA cues.  Skilled Therapeutic Interventions: Skilled treatment session focused on dysphagia goals. Upon arrival, patient was asleep but easily awakened. SLP facilitated session by offering a snack. Patient declined pureed textures but was agreeable to thin liquids. Patient became easily frustrated when SLP attempted to raise the [facility] to maximize safety with PO intake. Patient declined sitting up any higher and proceeded to demonstrate overall s/s of aspiration, suspect due to poor positioning. SLP eventually repositioned the patient into a safe upright position, however, patient was frustrated by the movement and rolled on his side and declined further participation. Therefore, patient missed last 10 minutes of session. Patient left upright in bed with alarm on and all needs within reach. Continue with current plan of care.        Pain Pain Assessment Pain Score: 0-No pain  Agitated Behavior Scale: TBI Observation Details Observation Environment: patient's room Start of observation period - Date: 06/08/21 Start of observation period - Time: 1335 End of observation period - Date: 06/08/21 End of observation period - Time: 1350 Agitated Behavior Scale (DO NOT LEAVE BLANKS) Short attention span, easy distractibility, inability to concentrate: Present to a slight degree Impulsive, impatient, low tolerance for pain or frustration: Present to a slight degree Uncooperative, resistant to care, demanding: Present to a slight degree Violent and/or threatening violence toward people or property: Absent Explosive and/or unpredictable anger: Present to a slight degree Rocking, rubbing, moaning, or other self-stimulating behavior: Absent Pulling at tubes, restraints, etc.: Absent Wandering from treatment areas: Absent Restlessness, pacing, excessive movement: Absent Repetitive behaviors, motor, and/or verbal: Absent Rapid, loud, or excessive talking: Absent Sudden changes of mood: Present to a slight degree Easily initiated or excessive crying and/or laughter: Absent Self-abusiveness, physical and/or verbal: Absent Agitated behavior scale total score: 19  Therapy/Group: Individual Therapy  Sajan Cheatwood 06/08/2021, 3:20 PM

## 2021-06-09 ENCOUNTER — Inpatient Hospital Stay (HOSPITAL_COMMUNITY): Payer: No Typology Code available for payment source

## 2021-06-09 DIAGNOSIS — I10 Essential (primary) hypertension: Secondary | ICD-10-CM | POA: Diagnosis not present

## 2021-06-09 DIAGNOSIS — R1312 Dysphagia, oropharyngeal phase: Secondary | ICD-10-CM | POA: Diagnosis not present

## 2021-06-09 DIAGNOSIS — S069X3S Unspecified intracranial injury with loss of consciousness of 1 hour to 5 hours 59 minutes, sequela: Secondary | ICD-10-CM | POA: Diagnosis not present

## 2021-06-09 DIAGNOSIS — E119 Type 2 diabetes mellitus without complications: Secondary | ICD-10-CM | POA: Diagnosis not present

## 2021-06-09 LAB — GLUCOSE, CAPILLARY
Glucose-Capillary: 109 mg/dL — ABNORMAL HIGH (ref 70–99)
Glucose-Capillary: 218 mg/dL — ABNORMAL HIGH (ref 70–99)
Glucose-Capillary: 100 mg/dL — ABNORMAL HIGH (ref 70–99)
Glucose-Capillary: 82 mg/dL (ref 70–99)
Glucose-Capillary: 81 mg/dL (ref 70–99)

## 2021-06-09 MED ORDER — CHOLESTYRAMINE 4 G PO PACK
4.0000 g | PACK | Freq: Two times a day (BID) | ORAL | Status: DC
  Administered 2021-06-09 – 2021-06-12 (×8): 4 g
  Filled 2021-06-09 (×9): qty 1

## 2021-06-09 MED ORDER — ONDANSETRON HCL 4 MG PO TABS: 4.0000 mg | ORAL_TABLET | Freq: Three times a day (TID) | ORAL | Status: DC | PRN

## 2021-06-09 NOTE — Progress Notes (Signed)
Occupational Therapy Session Note  Patient Details  Name: John Bryan MRN: [id number] Date of Birth: [birth year]  Today's Date: 06/09/2021 OT Individual Time: 1340-1400 OT Individual Time Calculation (min): 20 min    Short Term Goals: Week 1:  OT Short Term Goal 1 (Week 1): Pt will tolerate EOB ADLs with no supine rest breaks to increase functional activity tolerance OT Short Term Goal 2 (Week 1): Pt will don pants with max A OT Short Term Goal 3 (Week 1): Pt will complete sit > stand for toileting tasks with max A OT Short Term Goal 4 (Week 1): Pt will demonstrate improved intellectual awareness of deficits with mod cueing  Skilled Therapeutic Interventions/Progress Updates:    Patient off unit at diagnostic radiology.  Missed   40 minutes of therapy session.  He was alert , cooperative and willing to complete activities upon his return to the unit.  He notes dizziness with change of position.  Able to move from supine to sitting edge of bed with min a.   Completed visual scanning/letter cancellation activity with OS/OD with glasses on.  Increased difficulty with left eye - requires increased time due to difficulty with fixating (+nystagmus) but able to complete task monocular.  Returned to lying in bed with min A.  Bed alarm set and call bell in reach.    Therapy Documentation Precautions:  Precautions Precautions: Fall Precaution Comments: G-tube, fall, L 5th digit WBAT, sinus precautions Restrictions Weight Bearing Restrictions: Yes LUE Weight Bearing: Weight bearing as tolerated   Therapy/Group: Individual Therapy  [name] 06/09/2021, 7:42 AM

## 2021-06-09 NOTE — Progress Notes (Signed)
Speech Language Pathology TBI Note  Patient Details  Name: John Bryan MRN: [id number] Date of Birth: [birth year]  Today's Date: 06/09/2021 SLP Individual Time: 2119-4174 SLP Individual Time Calculation (min): 30 min Missed Time: 15 mins, patient ill/fatigue   Short Term Goals: Week 1: SLP Short Term Goal 1 (Week 1): Patient will tolerate trials of upgraded solids without overt s/s aspiration or penetration at supervisionA level. SLP Short Term Goal 2 (Week 1): Patient will orient to time, place, situation using external aides and/or memory notebook, with modA cues. SLP Short Term Goal 3 (Week 1): Patient will maintain adequate attention to complete functional tasks for durations of 3-5 minutes with modA cues. SLP Short Term Goal 4 (Week 1): Patient will demonstrate awareness to errors when completing basic level problem solving tasks with modA cues. SLP Short Term Goal 5 (Week 1): Patient will answer delayed recall questions after SLP read aloud of 2-3 sentence information/story, with 80% accuracy and modA cues.  Skilled Therapeutic Interventions: Skilled treatment session focused on dysphagia and cognitive goals. Upon arrival, patient supine in bed and reported feeling full. Patient declined to eat and reported he needed to use the bathroom. Patient followed commands for bed mobility and bedpan positioning appropriately. Patient was continent of bowel with bowel being extremely loose in which patient reported, "I told you I was sick." PA aware. Patient consumed several ice chips without overt s/s of aspiration but appeared to loosen dried secretions in his oral cavity that the patient independently removed. Patient declined further PO intake and requested to go to bed due to not feeling well. Therefore, patient missed remaining 15 minutes of session. Continue with current plan of care.      Pain No/Denies Pain   Agitated Behavior Scale: TBI Observation Details Observation Environment:  Patient's room Start of observation period - Date: 06/09/21 Start of observation period - Time: 0945 End of observation period - Date: 06/09/21 End of observation period - Time: 1015 Agitated Behavior Scale (DO NOT LEAVE BLANKS) Short attention span, easy distractibility, inability to concentrate: Present to a slight degree Impulsive, impatient, low tolerance for pain or frustration: Present to a slight degree Uncooperative, resistant to care, demanding: Absent Violent and/or threatening violence toward people or property: Absent Explosive and/or unpredictable anger: Absent Rocking, rubbing, moaning, or other self-stimulating behavior: Absent Pulling at tubes, restraints, etc.: Absent Wandering from treatment areas: Absent Restlessness, pacing, excessive movement: Absent Repetitive behaviors, motor, and/or verbal: Absent Rapid, loud, or excessive talking: Absent Sudden changes of mood: Absent Easily initiated or excessive crying and/or laughter: Absent Self-abusiveness, physical and/or verbal: Absent Agitated behavior scale total score: 16  Therapy/Group: Individual Therapy  Carolanne Mercier 06/09/2021, 3:18 PM
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
## 2021-06-09 NOTE — Plan of Care (Signed)
Pt's plan of care adjusted to 15/7 after speaking with care team and discussed with MD in team conference as pt currently unable to tolerate current therapy schedule with OT, PT, and SLP.   

## 2021-06-09 NOTE — Progress Notes (Signed)
Physical Therapy TBI Note  Patient Details  Name: John Bryan MRN: [id number] Date of Birth: 07/30/1951  Today's Date: 06/09/2021 PT Individual Time: 0800-0900 PT Individual Time Calculation (min): 60 min   Short Term Goals: Week 1:  PT Short Term Goal 1 (Week 1): Pt will transfer with mod assist  and LRAD to WC PT Short Term Goal 2 (Week 1): Pt will tolerate sitting in WC>2 hours between therapies PT Short Term Goal 3 (Week 1): Pt will ambulate 44ft with mod assist and LRAD  Skilled Therapeutic Interventions/Progress Updates:     Patient in bed asleep upon PT arrival. Patient aroused to verbal and tactile stimuli and agreeable to PT session. Patient reported 5-6/10 abdominal pain during session and 4/10 headache at end of session, RN made aware. PT provided repositioning, rest breaks, and distraction as pain interventions throughout session.   Focused session on bed mobility with bed level toileting and assessment of orthostatic vitals.  Orthostatic Vitals: Supine: BP 134/88, HR 86 (asymptomatic) Sitting: BP 133/75, HR 89 (mild lightheadedness with quick resolution, <30 sec) Standing: BP 80/53, HR 101 (significant symptoms of "lightheadedness" and "unsteadiness) RN made aware and in agreement to provide patient with TED hose for management of orthostasis.   Attempted to place patient's hearing aids, however they did not have a charge and patient was frustrated with placing them and asked PT to "just put them away." PT with clear mask donned during session to allow patient to read therapist's lips with improved communication this session due to hearing deficits. Applied eye patch over patient's left eye for reduced blurred vision and improved visual feedback with mobility due to L nystagmus and disconjugate gaze.  Therapeutic Activity: Bed Mobility: Patient requested the bed pan before and after standing. Patient was incontinent of bladder x1 and continent of bowl x2. Patient  declined use of BSC due to urgency of BM and utilized the bed pan with total A for placement. Performed rolling R/L x4 with mod progressing to min A with use of bed rails for peri-care and lower body clothing management with total A. Provided verbal cues for initiation and sequencing, required mod cues for initiation and hand-over-hand assist for reaching with opposite upper extremity x2. Both BM's noted to be liquid in consistency with foul odor, RN and MD made aware. Patient performed supine to/from sit with mod A-min A. Required 2 trials to come to sitting passing through side-lying due to abdominal upset and pain. Provided verbal cues for sequencing and technique to pass through side-lying and reduce intraabdominal pressure with mobility and bringing his knees to his chest to lift his legs onto the bed  Transfers: Patient performed sit to/from stand x1 with min A using a RW from an elevate bed, placed a piece of paper with the letter A on it in front of the patient for visual stabilization in standing. Provided verbal cues for maintaining gaze at the letter A during the transfer, hand placement on RW, and forward weight shift with facilitation. Patient remained in standing >1 min to obtain BP with CGA-min A for standing balance with B upper extremity support. Patient requested to return to lying due to symptoms in standing, see above, and bowl urgency.   Patient polite and pleasant throughout session. Displayed appropriate frustration with frequency and consistency of BMs and asked questions about medications to help, RN made aware.   Following second BM, patient expressed significant fatigue and increased abdominal pain and headache and requested to rest.  Patient  in bed with eyes closed at end of session with breaks locked, bed alarm set, and all needs within reach. Patient missed 15 min of skilled PT due to fatigue/abdominal pain, RN made aware. Will attempt to make-up missed time as able.    Therapy  Documentation Precautions:  Precautions Precautions: Fall Precaution Comments: G-tube, fall, L 5th digit WBAT, sinus precautions Restrictions Weight Bearing Restrictions: Yes LUE Weight Bearing: Weight bearing as tolerated General: PT Amount of Missed Time (min): 15 Minutes PT Missed Treatment Reason: Patient ill (Comment);Pain (abdominal pain with nausea)  Agitated Behavior Scale: TBI Observation Details Observation Environment: pt room Start of observation period - Date: 06/09/21 Start of observation period - Time: 0800 End of observation period - Date: 06/09/21 End of observation period - Time: 0900 Agitated Behavior Scale (DO NOT LEAVE BLANKS) Short attention span, easy distractibility, inability to concentrate: Absent Impulsive, impatient, low tolerance for pain or frustration: Present to a slight degree Uncooperative, resistant to care, demanding: Absent Violent and/or threatening violence toward people or property: Absent Explosive and/or unpredictable anger: Absent Rocking, rubbing, moaning, or other self-stimulating behavior: Absent Pulling at tubes, restraints, etc.: Absent Wandering from treatment areas: Absent Restlessness, pacing, excessive movement: Absent Repetitive behaviors, motor, and/or verbal: Absent Rapid, loud, or excessive talking: Absent Sudden changes of mood: Absent Easily initiated or excessive crying and/or laughter: Absent Self-abusiveness, physical and/or verbal: Absent Agitated behavior scale total score: 15    Therapy/Group: Individual Therapy  Coretta Leisey L Shaheen Mende PT, DPT  06/09/2021, 5:28 PM

## 2021-06-10 DIAGNOSIS — R1312 Dysphagia, oropharyngeal phase: Secondary | ICD-10-CM | POA: Diagnosis not present

## 2021-06-10 DIAGNOSIS — S069X3S Unspecified intracranial injury with loss of consciousness of 1 hour to 5 hours 59 minutes, sequela: Secondary | ICD-10-CM | POA: Diagnosis not present

## 2021-06-10 DIAGNOSIS — I10 Essential (primary) hypertension: Secondary | ICD-10-CM | POA: Diagnosis not present

## 2021-06-10 DIAGNOSIS — E119 Type 2 diabetes mellitus without complications: Secondary | ICD-10-CM | POA: Diagnosis not present

## 2021-06-10 LAB — BASIC METABOLIC PANEL
Anion gap: 8 (ref 5–15)
BUN: 18 mg/dL (ref 8–23)
CO2: 23 mmol/L (ref 22–32)
Calcium: 8.3 mg/dL — ABNORMAL LOW (ref 8.9–10.3)
Chloride: 102 mmol/L (ref 98–111)
Creatinine, Ser: 0.94 mg/dL (ref 0.61–1.24)
GFR, Estimated: >60 mL/min (ref >60)
Glucose, Bld: 160 mg/dL — ABNORMAL HIGH (ref 70–99)
Potassium: 3.3 mmol/L — ABNORMAL LOW (ref 3.5–5.1)
Sodium: 133 mmol/L — ABNORMAL LOW (ref 135–145)

## 2021-06-10 LAB — CBC
HCT: 40 % (ref 39.0–52.0)
Hemoglobin: 12.8 g/dL — ABNORMAL LOW (ref 13.0–17.0)
MCH: 29.8 pg (ref 26.0–34.0)
MCHC: 32 g/dL (ref 30.0–36.0)
MCV: 93.2 fL (ref 80.0–100.0)
Platelets: 261 10*3/uL (ref 150–400)
RBC: 4.29 MIL/uL (ref 4.22–5.81)
RDW: 14.4 % (ref 11.5–15.5)
WBC: 12.6 10*3/uL — ABNORMAL HIGH (ref 4.0–10.5)
nRBC: 0 % (ref 0.0–0.2)

## 2021-06-10 LAB — GLUCOSE, CAPILLARY
Glucose-Capillary: 109 mg/dL — ABNORMAL HIGH (ref 70–99)
Glucose-Capillary: 112 mg/dL — ABNORMAL HIGH (ref 70–99)
Glucose-Capillary: 90 mg/dL (ref 70–99)
Glucose-Capillary: 90 mg/dL (ref 70–99)

## 2021-06-10 MED ORDER — MEGESTROL ACETATE 400 MG/10ML PO SUSP
400.0000 mg | Freq: Two times a day (BID) | ORAL | Status: DC
  Administered 2021-06-10 – 2021-06-14 (×8): 400 mg via ORAL
  Filled 2021-06-10 (×9): qty 10

## 2021-06-10 MED ORDER — QUETIAPINE FUMARATE 25 MG PO TABS
25.0000 mg | ORAL_TABLET | Freq: Two times a day (BID) | ORAL | Status: DC
  Administered 2021-06-10 – 2021-06-24 (×29): 25 mg via ORAL
  Filled 2021-06-10 (×29): qty 1

## 2021-06-10 MED ORDER — ONDANSETRON HCL 4 MG/2ML IJ SOLN: 4.0000 mg | Freq: Three times a day (TID) | INTRAMUSCULAR | Status: DC | PRN

## 2021-06-10 MED ORDER — POTASSIUM CHLORIDE CRYS ER 20 MEQ PO TBCR
20.0000 meq | EXTENDED_RELEASE_TABLET | Freq: Two times a day (BID) | ORAL | Status: DC
  Administered 2021-06-10 – 2021-06-14 (×9): 20 meq via ORAL
  Filled 2021-06-10 (×9): qty 1

## 2021-06-10 MED ORDER — ENSURE ENLIVE PO LIQD
237.0000 mL | Freq: Three times a day (TID) | ORAL | Status: DC
  Administered 2021-06-10 – 2021-06-13 (×8): 237 mL via ORAL

## 2021-06-10 NOTE — Progress Notes (Addendum)
Speech Language Pathology TBI Note  Patient Details  Name: John Bryan MRN: [id number] Date of Birth: 1951/10/26  Today's Date: 06/10/2021 SLP Individual Time: 1300-1345 SLP Individual Time Calculation (min): 45 min  Short Term Goals: Week 2: SLP Short Term Goal 1 (Week 2): Patient will demonstrate efficient mastication and complete oral clearance without overt s/s of aspiration with Min verbal cues over 2 sessions prior to upgrade. SLP Short Term Goal 2 (Week 2): Patient will demonstrate tolerance of thin liquids by minimal overt s/s of aspiration via straw with Min verbal cues for use of swallowing compensatory strategies. SLP Short Term Goal 3 (Week 2): Patient will demonstrate sustained attention to  functional tasks for durations of 3-5 minutes with mod verbal cues for redirection. SLP Short Term Goal 4 (Week 2): Patient will orient to time, place, situation using external aides with mod verbal and visual cues. SLP Short Term Goal 5 (Week 2): Patient will demonstrate awareness to errors when completing basic level problem solving tasks with mod verbal cues.  Skilled Therapeutic Interventions: Patient agreeable to ST intervention with focus on cognitive goals. SLP offered patient a snack and/or drink for PO trials but patient declined while stating he felt like he was going to have a bowel movement. SLP offered to get bed pan in place but patient insistently declined. Patient did not have a bowel movement during duration of session. SLP facilitated basic verbal reasoning task with mod-to-max A semantic cues for processing. Patient started to become frustrated and SLP eventually simplified activity where he required min A verbal cues for successful completion. Patient was limited by stomach discomfort and headache today and was only able to sustain attention for intervals of 5 minutes. Patient's spouse arrived at end of session and updated on patient's status today, performance during session,  and addressed questions related to cognitive impairments. Patient was left in bed with alarm activated and needs within reach at end of session. Nurse present at bedside. Continue per ST plan of care.    Pain Pain Assessment Pain Scale: 0-10 Pain Score: 8  Pain Type: Acute pain Pain Location: Head Pain Orientation: Mid Pain Descriptors / Indicators: Headache Pain Frequency: Constant Pain Onset: On-going Patients Stated Pain Goal: 2 Pain Intervention(s): Medication (See eMAR) Multiple Pain Sites: No  Agitated Behavior Scale: TBI Observation Details Observation Environment: pt room Start of observation period - Date: 06/10/21 Start of observation period - Time: 1415 End of observation period - Date: 06/10/21 End of observation period - Time: 1510 Agitated Behavior Scale (DO NOT LEAVE BLANKS) Short attention span, easy distractibility, inability to concentrate: Present to a moderate degree Impulsive, impatient, low tolerance for pain or frustration: Present to a moderate degree Uncooperative, resistant to care, demanding: Present to a slight degree Violent and/or threatening violence toward people or property: Absent Explosive and/or unpredictable anger: Present to a slight degree Rocking, rubbing, moaning, or other self-stimulating behavior: Absent Pulling at tubes, restraints, etc.: Absent Wandering from treatment areas: Absent Restlessness, pacing, excessive movement: Absent Repetitive behaviors, motor, and/or verbal: Absent Rapid, loud, or excessive talking: Absent Sudden changes of mood: Present to a slight degree Easily initiated or excessive crying and/or laughter: Absent Self-abusiveness, physical and/or verbal: Absent Agitated behavior scale total score: 21  Therapy/Group: Individual Therapy  [name] 06/10/2021, 3:56 PM

## 2021-06-10 NOTE — Progress Notes (Signed)
Occupational Therapy TBI Note  Patient Details  Name: John Bryan MRN: [id number] Date of Birth: [birth year]  Today's Date: 06/10/2021 OT Individual Time: 0867-6195 OT Individual Time Calculation (min): 60 min    Short Term Goals: Week 1:  OT Short Term Goal 1 (Week 1): Pt will tolerate EOB ADLs with no supine rest breaks to increase functional activity tolerance OT Short Term Goal 2 (Week 1): Pt will don pants with max A OT Short Term Goal 3 (Week 1): Pt will complete sit > stand for toileting tasks with max A OT Short Term Goal 4 (Week 1): Pt will demonstrate improved intellectual awareness of deficits with mod cueing  Skilled Therapeutic Interventions/Progress Updates:   Pt supine with RN adminsitering medication. Pt confused re role of RN vs OT and becoming agitated when RN left the room stating "stop sending people away!" Explained scheduled session and role of OT and pt was able to calm down. He was saturated in urine, including all bed linens. Poor awareness of incontinence. Pt completed bed mobility R and L for max A hygiene and linen change. Pt initially resistant to participating in peri hygiene but with calming cues and encouragement he was able to complete anterior hygiene with set up assist while supine. Initiated transfer to EOB when pt reported bowel incontinence. Pt initially declined bed pan and continued to move bowels in the incontinence brief. Pt was encouraged to protect skin integrity and bedpan was placed. Pt had no further void and was taken off bedpan. He transferred to EOB with increased time, mod cueing for technique, and min A. He sat unsupported with no LOB while he completed UB bathing with set up assist. Min A to don shirt. Pt returned to supine, reporting his dizziness was increasing. Pt left supine with all needs met, bed alarm set.   Therapy Documentation Precautions:  Precautions Precautions: Fall Precaution Comments: G-tube, fall, L 5th digit WBAT, sinus  precautions Restrictions Weight Bearing Restrictions: Yes LUE Weight Bearing: Weight bearing as tolerated  Pain Assessment Pain Scale: 0-10 Pain Score: 0-No pain Agitated Behavior Scale: TBI Observation Details Observation Environment: pt room Start of observation period - Date: 06/10/21 Start of observation period - Time: 0830 End of observation period - Date: 06/10/21 End of observation period - Time: 0930 Agitated Behavior Scale (DO NOT LEAVE BLANKS) Short attention span, easy distractibility, inability to concentrate: Present to a slight degree Impulsive, impatient, low tolerance for pain or frustration: Present to a slight degree Uncooperative, resistant to care, demanding: Present to a slight degree Violent and/or threatening violence toward people or property: Absent Explosive and/or unpredictable anger: Present to a slight degree Rocking, rubbing, moaning, or other self-stimulating behavior: Absent Pulling at tubes, restraints, etc.: Absent Wandering from treatment areas: Absent Restlessness, pacing, excessive movement: Absent Repetitive behaviors, motor, and/or verbal: Absent Rapid, loud, or excessive talking: Present to a slight degree Sudden changes of mood: Absent Easily initiated or excessive crying and/or laughter: Absent Self-abusiveness, physical and/or verbal: Absent Agitated behavior scale total score: 19     Therapy/Group: Individual Therapy  Curtis Sites 06/10/2021, 12:20 PM

## 2021-06-10 NOTE — Plan of Care (Signed)
  Problem: Consults Goal: RH BRAIN INJURY PATIENT EDUCATION Description: Description: See Patient Education module for eduction specifics Outcome: Progressing Goal: Skin Care Protocol Initiated - if Braden Score 18 or less Description: If consults are not indicated, leave blank or document N/A Outcome: Progressing Goal: Nutrition Consult-if indicated Outcome: Progressing Goal: Diabetes Guidelines if Diabetic/Glucose > 140 Description: If diabetic or lab glucose is > 140 mg/dl - Initiate Diabetes/Hyperglycemia Guidelines & Document Interventions  Outcome: Progressing   Problem: RH BOWEL ELIMINATION Goal: RH STG MANAGE BOWEL WITH ASSISTANCE Description: STG Manage Bowel with Supervision Assistance. Outcome: Progressing   Problem: RH BLADDER ELIMINATION Goal: RH STG MANAGE BLADDER WITH ASSISTANCE Description: STG Manage Bladder With Supervision Assistance Outcome: Progressing   Problem: RH SKIN INTEGRITY Goal: RH STG MAINTAIN SKIN INTEGRITY WITH ASSISTANCE Description: STG Maintain Skin Integrity With Supervision Assistance. Outcome: Progressing Goal: RH STG ABLE TO PERFORM INCISION/WOUND CARE W/ASSISTANCE Description: STG Able To Perform Incision/Wound Care With Supervision Assistance. Outcome: Progressing   Problem: RH SAFETY Goal: RH STG ADHERE TO SAFETY PRECAUTIONS W/ASSISTANCE/DEVICE Description: STG Adhere to Safety Precautions With Supervision Assistance/Device. Outcome: Progressing Goal: RH STG DECREASED RISK OF FALL WITH ASSISTANCE Description: STG Decreased Risk of Fall With Cues and Reminders. Outcome: Progressing   Problem: RH COGNITION-NURSING Goal: RH STG USES MEMORY AIDS/STRATEGIES W/ASSIST TO PROBLEM SOLVE Description: STG Uses Memory Aids/Strategies With Cues and Reminders to Problem Solve. Outcome: Progressing Goal: RH STG ANTICIPATES NEEDS/CALLS FOR ASSIST W/ASSIST/CUES Description: STG Anticipates Needs/Calls for Assist With Supervision  Assistance/Cues. Outcome: Progressing   Problem: RH PAIN MANAGEMENT Goal: RH STG PAIN MANAGED AT OR BELOW PT'S PAIN GOAL Description: <4 on 0-10 pain scale. Outcome: Progressing   Problem: RH KNOWLEDGE DEFICIT BRAIN INJURY Goal: RH STG INCREASE KNOWLEDGE OF SELF CARE AFTER BRAIN INJURY Description: Patient will be able to demonstrate knowledge of medication management, pain management, skin/wound care, and weight bearing precautions with educational materials and handouts provided by staff independently at discharge. Outcome: Progressing   Problem: RH Vision Goal: RH LTG Vision (Specify) Outcome: Progressing
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
## 2021-06-10 NOTE — Progress Notes (Signed)
Occupational Therapy Note  Patient Details  Name: John Bryan MRN: [id number] Date of Birth: 1951/06/19  Today's Date: 06/10/2021 OT Missed Time: 30 Minutes Missed Time Reason: Pain (headache; nurse notified)  Pt semiupright in bed, reports "not doing so well" upon OT entering.  Pt complaining of significant headache and stated "being vertical is the last thing I want to do right now".  Nurse made aware of pts pain level and location. Gently encouraged pt to participate in light activity, however pt continuing to refuse.  Pt provided with cold washcloth application to forehead for comfort.  Call bell in reach, bed alarm on, telesitter on upon OT departure.  Victoria Henshaw L Taher Vannote 06/10/2021, 3:00 PM
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
## 2021-06-10 NOTE — Progress Notes (Signed)
Speech Language Pathology Weekly Progress Note  Patient Details  Name: John Bryan MRN: [id number] Date of Birth: 09-14-1951  Beginning of progress report period: June 03, 2021 End of progress report period: June 10, 2021   Short Term Goals: Week 1: SLP Short Term Goal 1 (Week 1): Patient will tolerate trials of upgraded solids without overt s/s aspiration or penetration at supervisionA level. SLP Short Term Goal 1 - Progress (Week 1): Not met SLP Short Term Goal 2 (Week 1): Patient will orient to time, place, situation using external aides and/or memory notebook, with modA cues. SLP Short Term Goal 2 - Progress (Week 1): Not met SLP Short Term Goal 3 (Week 1): Patient will maintain adequate attention to complete functional tasks for durations of 3-5 minutes with modA cues. SLP Short Term Goal 3 - Progress (Week 1): Not met SLP Short Term Goal 4 (Week 1): Patient will demonstrate awareness to errors when completing basic level problem solving tasks with modA cues. SLP Short Term Goal 4 - Progress (Week 1): Not met SLP Short Term Goal 5 (Week 1): Patient will answer delayed recall questions after SLP read aloud of 2-3 sentence information/story, with 80% accuracy and modA cues. SLP Short Term Goal 5 - Progress (Week 1): Discontinued (comment)    New Short Term Goals: Week 2: SLP Short Term Goal 1 (Week 2): Patient will demonstrate efficient mastication and complete oral clearance without overt s/s of aspiration with Min verbal cues over 2 sessions prior to upgrade. SLP Short Term Goal 2 (Week 2): Patient will demonstrate tolerance of thin liquids by minimal overt s/s of aspiration via straw with Min verbal cues for use of swallowing compensatory strategies. SLP Short Term Goal 3 (Week 2): Patient will demonstrate sustained attention to  functional tasks for durations of 3-5 minutes with mod verbal cues for redirection. SLP Short Term Goal 4 (Week 2): Patient will orient to time, place,  situation using external aides with mod verbal and visual cues. SLP Short Term Goal 5 (Week 2): Patient will demonstrate awareness to errors when completing basic level problem solving tasks with mod verbal cues.  Weekly Progress Updates: Patient has made minimal gains and has not met any STGs this reporting period. Patient's overall participation and progress has been impacted by constant stomach pain with feeling "full," frequent loose stools, fatigue, dizziness with nausea and intermittent vomiting. Because of this, patient has minimal PO intake and has been only agreeable to ice chips and receiving all nutritional support via PEG. Patient has also been declining trials of upgraded textures. Patient continues to demonstrate  behaviors consistent with a [place] Level V-VI and requires encouragement for participation and overall Max A multimodal cues to complete functional and familiar tasks safely in regards to attention, initiation, orientation, recall and problem solving. Patient also has intermittent confusion with a low frustration tolerance and intermittent verbal agitation. Patient is extremely hard of hearing which also causes miscommunications and adds to the patient's overall frustration.  Patient and family education ongoing. Patient would benefit from continued skilled SLP intervention to maximize his cognitive and swallowing function prior to discharge.      Intensity: Minumum of 1-2 x/day, 30 to 90 minutes Frequency: 3 to 5 out of 7 days Duration/Length of Stay: 3 weeks Treatment/Interventions: Cognitive remediation/compensation;Dysphagia/aspiration precaution training;Internal/external aids;Cueing hierarchy;Functional tasks;Patient/family education;Therapeutic Activities;Environmental controls     [place], [place] 06/10/2021, 6:16 AM

## 2021-06-10 NOTE — Progress Notes (Addendum)
Physical Therapy Session Note  Patient Details  Name: John Bryan MRN: [id number] Date of Birth: 08-21-1951  Today's Date: 06/10/2021 PT Individual Time: 1415-1510 PT Individual Time Calculation (min): 55 min   Short Term Goals: Week 1:  PT Short Term Goal 1 (Week 1): Pt will transfer with mod assist  and LRAD to WC PT Short Term Goal 2 (Week 1): Pt will tolerate sitting in WC>2 hours between therapies PT Short Term Goal 3 (Week 1): Pt will ambulate 82ft with mod assist and LRAD Week 2:    Week 3:     Skilled Therapeutic Interventions/Progress Updates:    Pain:  Pt did not c/o pain during session.  Very sleepy which wife atttibutes to pain meds and time of day. Treatment to tolerance.  Rest breaks and repositioning as needed.  Pt initially receiving nursing care due to bowel incontinence.  Initially refuses treatment/OOB activtity but wife present and firmly persuades patient.  Therapist obtained [name] and pt lifts feet for therapist to donn.  Educated pt and wife re: use of [name] to combat OH.  Pt performed bridghing x 10 Eyepatch applied by therapist to L eye. Supine to sit w/mod assist and max cues for sequencing/initiation, attention to task.    Pt dozing in and out of sleep in sitting but maintains balance w/cga. Sit to stand w/mod assist to RW.  Pt stands approx 2 min x 1, 1 min x 3 w/cga.  Pt confused and disoriented, eyes closed during standing but maintains upright w/min assist to steady. Wife states "I haven't seen him stand in so long, I am tearing up".   Sit to stand to RW w/mod assist then pt sidestepped 4 steps to head of bed w/min assist.  Transfers to sitting w/cga.   Pt refused to lie down in bed initially stating "I am not laying down on that! I am going to bed!".  Second attempt pt attempts to lie down w/head at foot of bed, redirected w/max assist for LE transition to bed.  Pt falls asleep immediately in sidelying. Wife w/questions regarding dc planning.   States she was told that he could not be DC to home unless he was 100% and would otherwise have to be dc to nursing home.  Educated wife that pt could and ideally would be dc to home if family  was able to provide level of care needed.  Discussed family training prior to DC and options for [facility] vs OP therapies at DC depending on transportation/level of functioning.    Pt left supine w/rails up x 4, alarm set, bed in lowest position, and needs in reach.  AGITATED BEHAVIOR SCALE = 21, SEE FLOWSHEET   Therapy Documentation Precautions:  Precautions Precautions: Fall Precaution Comments: G-tube, fall, L 5th digit WBAT, sinus precautions Restrictions Weight Bearing Restrictions: Yes LUE Weight Bearing: Weight bearing as tolerated    Therapy/Group: Individual Therapy [name], PT   [name] 06/10/2021, 3:21 PM

## 2021-06-11 DIAGNOSIS — E119 Type 2 diabetes mellitus without complications: Secondary | ICD-10-CM | POA: Diagnosis not present

## 2021-06-11 DIAGNOSIS — R1312 Dysphagia, oropharyngeal phase: Secondary | ICD-10-CM | POA: Diagnosis not present

## 2021-06-11 DIAGNOSIS — S069X0S Unspecified intracranial injury without loss of consciousness, sequela: Secondary | ICD-10-CM | POA: Diagnosis not present

## 2021-06-11 LAB — URINALYSIS, COMPLETE (UACMP) WITH MICROSCOPIC
Bacteria, UA: NONE SEEN
Bilirubin Urine: NEGATIVE
Glucose, UA: NEGATIVE mg/dL
Hgb urine dipstick: NEGATIVE
Ketones, ur: NEGATIVE mg/dL
Leukocytes,Ua: NEGATIVE
Nitrite: NEGATIVE
Protein, ur: NEGATIVE mg/dL
Specific Gravity, Urine: 1.008 (ref 1.005–1.030)
pH: 6 (ref 5.0–8.0)

## 2021-06-11 LAB — GLUCOSE, CAPILLARY
Glucose-Capillary: 143 mg/dL — ABNORMAL HIGH (ref 70–99)
Glucose-Capillary: 192 mg/dL — ABNORMAL HIGH (ref 70–99)
Glucose-Capillary: 303 mg/dL — ABNORMAL HIGH (ref 70–99)
Glucose-Capillary: 79 mg/dL (ref 70–99)
Glucose-Capillary: 85 mg/dL (ref 70–99)

## 2021-06-11 MED ORDER — CLONIDINE HCL 0.1 MG PO TABS
0.2000 mg | ORAL_TABLET | Freq: Three times a day (TID) | ORAL | Status: DC
  Administered 2021-06-11 – 2021-06-25 (×42): 0.2 mg
  Filled 2021-06-11 (×42): qty 2
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
## 2021-06-11 NOTE — Progress Notes (Signed)
Occupational Therapy TBI Note  Patient Details  Name: John Bryan MRN: [id number] Date of Birth: January 30, 1951  Today's Date: 06/11/2021 OT Individual Time:  -    [id number] total individual time=9 minutes.  Patient missed51 minutes of therapy.  Refused   Short Term Goals: Week 1:  OT Short Term Goal 1 (Week 1): Pt will tolerate EOB ADLs with no supine rest breaks to increase functional activity tolerance OT Short Term Goal 2 (Week 1): Pt will don pants with max A OT Short Term Goal 3 (Week 1): Pt will complete sit > stand for toileting tasks with max A OT Short Term Goal 4 (Week 1): Pt will demonstrate improved intellectual awareness of deficits with mod cueing  Skilled Therapeutic Interventions/Progress Updates: Upon approach for OT therapy, patient was sleeping.   This clinican touched his left leg through covers after several attempts to gently arouse and greet were unsuccessful at helping patient to arouse from sleep.  ONce he aroused this clinician offered him a wash cloth an dprovided hand over hand assist for him to wash face with this right hand.   He stopped washing and dozed off again briefly and then arouse with another hand over hand assist.  He stayed away and washed a portion of eyes and verbalized disoriented statements.   He declined offer to bath and dress and complete oral care.    He stated "Aht oh I am going again [bowel movement].   This clinician instructed him to state when he was finished.  Upon finishing he independeently inititated using his call bell and told nurse he had 'pooped and need a clean up."     This clinciian offered to help him clean up, and he stated, "yes."   However when this clinician began to help him unfasten he brief, he briskly stateed, "No not you.   That is for the cleanup people."   He loudly x2 declined and stated, "Not you.  I am waiting on the cleanup people."  This clincian backed away and paused.  After about 5 minutes this clincian again offered  to assist him with oral care while he waited to get cleaned of the BM.  He stated, "No."  Session ended and his bed alarm was left engaged.   This clinican told patient thank you and exited room.  Continue OT Plan of care.     Therapy Documentation Precautions:  Precautions Precautions: Fall Precaution Comments: G-tube, fall, L 5th digit WBAT, sinus precautions Restrictions Weight Bearing Restrictions: No LUE Weight Bearing: Weight bearing as tolerated General:   Pain: Pain Assessment Pain Scale: 0-10 Pain Score: 0-No pain Agitated Behavior Scale:23 TBI       Therapy/Group: Individual Therapy  [facility] 06/11/2021, 11:40 AM

## 2021-06-11 NOTE — Plan of Care (Signed)
  Problem: Consults Goal: RH BRAIN INJURY PATIENT EDUCATION Description: Description: See Patient Education module for eduction specifics Outcome: Progressing Goal: Skin Care Protocol Initiated - if Braden Score 18 or less Description: If consults are not indicated, leave blank or document N/A Outcome: Progressing Goal: Nutrition Consult-if indicated Outcome: Progressing Goal: Diabetes Guidelines if Diabetic/Glucose > 140 Description: If diabetic or lab glucose is > 140 mg/dl - Initiate Diabetes/Hyperglycemia Guidelines & Document Interventions  Outcome: Progressing   Problem: RH BOWEL ELIMINATION Goal: RH STG MANAGE BOWEL WITH ASSISTANCE Description: STG Manage Bowel with Supervision Assistance. Outcome: Progressing   Problem: RH BLADDER ELIMINATION Goal: RH STG MANAGE BLADDER WITH ASSISTANCE Description: STG Manage Bladder With Supervision Assistance Outcome: Progressing   Problem: RH SKIN INTEGRITY Goal: RH STG MAINTAIN SKIN INTEGRITY WITH ASSISTANCE Description: STG Maintain Skin Integrity With Supervision Assistance. Outcome: Progressing Goal: RH STG ABLE TO PERFORM INCISION/WOUND CARE W/ASSISTANCE Description: STG Able To Perform Incision/Wound Care With Supervision Assistance. Outcome: Progressing   Problem: RH SAFETY Goal: RH STG ADHERE TO SAFETY PRECAUTIONS W/ASSISTANCE/DEVICE Description: STG Adhere to Safety Precautions With Supervision Assistance/Device. Outcome: Progressing Goal: RH STG DECREASED RISK OF FALL WITH ASSISTANCE Description: STG Decreased Risk of Fall With Cues and Reminders. Outcome: Progressing   Problem: RH COGNITION-NURSING Goal: RH STG USES MEMORY AIDS/STRATEGIES W/ASSIST TO PROBLEM SOLVE Description: STG Uses Memory Aids/Strategies With Cues and Reminders to Problem Solve. Outcome: Progressing Goal: RH STG ANTICIPATES NEEDS/CALLS FOR ASSIST W/ASSIST/CUES Description: STG Anticipates Needs/Calls for Assist With Supervision  Assistance/Cues. Outcome: Progressing   Problem: RH PAIN MANAGEMENT Goal: RH STG PAIN MANAGED AT OR BELOW PT'S PAIN GOAL Description: <4 on 0-10 pain scale. Outcome: Progressing   Problem: RH KNOWLEDGE DEFICIT BRAIN INJURY Goal: RH STG INCREASE KNOWLEDGE OF SELF CARE AFTER BRAIN INJURY Description: Patient will be able to demonstrate knowledge of medication management, pain management, skin/wound care, and weight bearing precautions with educational materials and handouts provided by staff independently at discharge. Outcome: Progressing   Problem: RH Vision Goal: RH LTG Vision (Specify) Outcome: Progressing   

## 2021-06-11 NOTE — Progress Notes (Signed)
Speech Language Pathology TBI Note  Patient Details  Name: John Bryan MRN: [id number] Date of Birth: 1951-04-25  Today's Date: 06/11/2021 SLP Individual Time: 7106-2694 SLP Individual Time Calculation (min): 35 min and Today's Date: 06/11/2021 SLP Missed Time: 10 Minutes Missed Time Reason: Nursing care  Short Term Goals: Week 2: SLP Short Term Goal 1 (Week 2): Patient will demonstrate efficient mastication and complete oral clearance without overt s/s of aspiration with Min verbal cues over 2 sessions prior to upgrade. SLP Short Term Goal 2 (Week 2): Patient will demonstrate tolerance of thin liquids by minimal overt s/s of aspiration via straw with Min verbal cues for use of swallowing compensatory strategies. SLP Short Term Goal 3 (Week 2): Patient will demonstrate sustained attention to  functional tasks for durations of 3-5 minutes with mod verbal cues for redirection. SLP Short Term Goal 4 (Week 2): Patient will orient to time, place, situation using external aides with mod verbal and visual cues. SLP Short Term Goal 5 (Week 2): Patient will demonstrate awareness to errors when completing basic level problem solving tasks with mod verbal cues.  Skilled Therapeutic Interventions:   Upon entry, pt required assistance being changed d/t bowel movement. Pt was pleasant and cooperative throughout today's session. Pt demonstrated sustained attention throughout session for conversation. He required occasional repetition of questions/directions due to being [facility]. Pt was oriented to time and situation; however, benefited from mod verbal cues for place (referred to [facility] as "[facility]"). Pt benefits from calendar in room to assist in with orientation. Pt demonstrated adequate sustained attention this session for conversation. Pt appropriately answered all questions and asked questions in return demonstrating topic maintenance. Cueing x1 to redirect for attention.   Pt left supine in bed with  bed alarm in place; all needs within reach. Continue with current plan of care.   Pain Pain Assessment Pain Scale: 0-10 Pain Score: 0-No pain  Agitated Behavior Scale: TBI Observation Details Observation Environment: Pt room Start of observation period - Date: 06/11/21 Start of observation period - Time: 0740 End of observation period - Date: 06/11/21 End of observation period - Time: 0815 Agitated Behavior Scale (DO NOT LEAVE BLANKS) Short attention span, easy distractibility, inability to concentrate: Present to a slight degree Impulsive, impatient, low tolerance for pain or frustration: Present to a slight degree Uncooperative, resistant to care, demanding: Absent Violent and/or threatening violence toward people or property: Absent Explosive and/or unpredictable anger: Absent Rocking, rubbing, moaning, or other self-stimulating behavior: Absent Pulling at tubes, restraints, etc.: Absent Wandering from treatment areas: Absent Restlessness, pacing, excessive movement: Absent Repetitive behaviors, motor, and/or verbal: Absent Rapid, loud, or excessive talking: Absent Sudden changes of mood: Absent Easily initiated or excessive crying and/or laughter: Absent Self-abusiveness, physical and/or verbal: Absent Agitated behavior scale total score: 16  Therapy/Group: Individual Therapy  Dorena Bodo MS, CCC-SLP, CBIS  06/11/2021, 12:28 PM
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
## 2021-06-12 DIAGNOSIS — E119 Type 2 diabetes mellitus without complications: Secondary | ICD-10-CM | POA: Diagnosis not present

## 2021-06-12 DIAGNOSIS — S069X0S Unspecified intracranial injury without loss of consciousness, sequela: Secondary | ICD-10-CM | POA: Diagnosis not present

## 2021-06-12 DIAGNOSIS — R1312 Dysphagia, oropharyngeal phase: Secondary | ICD-10-CM | POA: Diagnosis not present

## 2021-06-12 LAB — GLUCOSE, CAPILLARY
Glucose-Capillary: 113 mg/dL — ABNORMAL HIGH (ref 70–99)
Glucose-Capillary: 124 mg/dL — ABNORMAL HIGH (ref 70–99)
Glucose-Capillary: 252 mg/dL — ABNORMAL HIGH (ref 70–99)
Glucose-Capillary: 69 mg/dL — ABNORMAL LOW (ref 70–99)
Glucose-Capillary: 79 mg/dL (ref 70–99)

## 2021-06-12 LAB — CBC WITH DIFFERENTIAL/PLATELET
Abs Immature Granulocytes: 0.06 10*3/uL (ref 0.00–0.07)
Basophils Absolute: 0 10*3/uL (ref 0.0–0.1)
Basophils Relative: 1 %
Eosinophils Absolute: 0.3 10*3/uL (ref 0.0–0.5)
Eosinophils Relative: 5 %
HCT: 38.5 % — ABNORMAL LOW (ref 39.0–52.0)
Hemoglobin: 12.9 g/dL — ABNORMAL LOW (ref 13.0–17.0)
Immature Granulocytes: 1 %
Lymphocytes Relative: 22 %
Lymphs Abs: 1.4 10*3/uL (ref 0.7–4.0)
MCH: 30.1 pg (ref 26.0–34.0)
MCHC: 33.5 g/dL (ref 30.0–36.0)
MCV: 90 fL (ref 80.0–100.0)
Monocytes Absolute: 1.4 10*3/uL — ABNORMAL HIGH (ref 0.1–1.0)
Monocytes Relative: 23 %
Neutro Abs: 3.1 10*3/uL (ref 1.7–7.7)
Neutrophils Relative %: 48 %
Platelets: 255 10*3/uL (ref 150–400)
RBC: 4.28 MIL/uL (ref 4.22–5.81)
RDW: 14.2 % (ref 11.5–15.5)
WBC: 6.4 10*3/uL (ref 4.0–10.5)
nRBC: 0 % (ref 0.0–0.2)

## 2021-06-12 LAB — URINE CULTURE: Culture: <10000 — AB

## 2021-06-12 MED ORDER — GLUCAGON HCL RDNA (DIAGNOSTIC) 1 MG IJ SOLR
INTRAMUSCULAR | Status: AC
  Filled 2021-06-12: qty 1

## 2021-06-12 MED ORDER — CALCIUM POLYCARBOPHIL 625 MG PO TABS
625.0000 mg | ORAL_TABLET | Freq: Three times a day (TID) | ORAL | Status: DC
  Administered 2021-06-12 – 2021-06-14 (×5): 625 mg via ORAL
  Filled 2021-06-12 (×5): qty 1

## 2021-06-12 MED ORDER — CALCIUM POLYCARBOPHIL 625 MG PO TABS: 625.0000 mg | ORAL_TABLET | Freq: Two times a day (BID) | ORAL | Status: DC

## 2021-06-12 MED ORDER — TAMSULOSIN HCL 0.4 MG PO CAPS
0.4000 mg | ORAL_CAPSULE | Freq: Every day | ORAL | Status: DC
  Administered 2021-06-12: 0.4 mg via ORAL
  Filled 2021-06-12: qty 1

## 2021-06-12 MED ORDER — LOPERAMIDE HCL 2 MG PO CAPS
2.0000 mg | ORAL_CAPSULE | ORAL | Status: DC | PRN
  Administered 2021-06-12 – 2021-06-15 (×3): 2 mg via ORAL
  Filled 2021-06-12 (×3): qty 1

## 2021-06-12 MED ORDER — GLUCAGON HCL RDNA (DIAGNOSTIC) 1 MG IJ SOLR: 1.0000 mg | INTRAMUSCULAR | Status: DC

## 2021-06-12 MED ORDER — OSMOLITE 1.5 CAL PO LIQD
120.0000 mL | Freq: Four times a day (QID) | ORAL | Status: DC
  Administered 2021-06-13 – 2021-06-14 (×5): 120 mL
  Filled 2021-06-12: qty 237

## 2021-06-12 MED ORDER — INSULIN ASPART PROT & ASPART (70-30 MIX) 100 UNIT/ML ~~LOC~~ SUSP
35.0000 [IU] | Freq: Two times a day (BID) | SUBCUTANEOUS | Status: DC
  Administered 2021-06-12 – 2021-06-17 (×9): 35 [IU] via SUBCUTANEOUS
  Filled 2021-06-12 (×2): qty 10

## 2021-06-12 MED ORDER — INSULIN ASPART PROT & ASPART (70-30 MIX) 100 UNIT/ML ~~LOC~~ SUSP
41.0000 [IU] | Freq: Two times a day (BID) | SUBCUTANEOUS | Status: DC
  Filled 2021-06-12: qty 10

## 2021-06-12 MED ORDER — INSULIN ASPART PROT & ASPART (70-30 MIX) 100 UNIT/ML ~~LOC~~ SUSP
40.0000 [IU] | Freq: Two times a day (BID) | SUBCUTANEOUS | Status: DC
  Filled 2021-06-12: qty 10
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
## 2021-06-12 NOTE — Progress Notes (Signed)
Physical Therapy Weekly Progress Note  Patient Details  Name: John Bryan MRN: [id number] Date of Birth: Nov 12, 1951  Beginning of progress report period: June 04, 2021 End of progress report period: June 12, 2021  Today's Date: 06/12/2021 PT Individual Time: 1300-1400 and 1515-1530 PT Individual Time Calculation (min): 60 min and 15 min  Patient has met 0 of 3 short term goals.  Patient very limited with progress this week due to frequency of incontinent loose BMs and dizziness with change in positioning due to orthostatic hypotension and visual/vestibular deficits. Patient currently performed bed mobility with min A, sit to stand transfers with min/mod A using RW, and side stepping EOB with min/mod A using RW.   Patient continues to demonstrate the following deficits muscle weakness, decreased cardiorespiratoy endurance, decreased visual acuity, decreased visual perceptual skills, and decreased visual motor skills, decreased attention, decreased awareness, and decreased memory, central origin, and decreased sitting balance, decreased standing balance, decreased postural control, and decreased balance strategies and therefore will continue to benefit from skilled PT intervention to increase functional independence with mobility.  Patient progressing toward long term goals.  Continue plan of care. Will reassess POC next week based on progress with patient's symptoms currenly limiting mobility.   PT Short Term Goals Week 1:  PT Short Term Goal 1 (Week 1): Pt will transfer with mod assist  and LRAD to WC PT Short Term Goal 1 - Progress (Week 1): Progressing toward goal PT Short Term Goal 2 (Week 1): Pt will tolerate sitting in WC>2 hours between therapies PT Short Term Goal 2 - Progress (Week 1): Progressing toward goal PT Short Term Goal 3 (Week 1): Pt will ambulate 63f with mod assist and LRAD PT Short Term Goal 3 - Progress (Week 1): Progressing toward goal Week 2:  PT Short Term Goal 1  (Week 2): Pt will transfer with mod assist  and LRAD to WC PT Short Term Goal 2 (Week 2): Pt will tolerate sitting in WC 1 hour between therapies PT Short Term Goal 3 (Week 2): Pt will ambulate 257fwith mod assist and LRAD  Skilled Therapeutic Interventions/Progress Updates:     Session 1: Patient in bed with NT and RN performing peri-care due to incontinent BM upon PT arrival. Patient alert and agreeable to PT session. Patient reported 5/10 sacral/rectal pain with peri-care and  during session, RN made aware. PT provided repositioning, rest breaks, and distraction as pain interventions throughout session.   Patient emotional during session, reporting increased frequency and decreased control with BMs. He is also distressed by being soiled while having to wait for nursing staff to provide peri-care and change his brief. He also reports poor sleep from both BMs and "horrible" headaches at night. Patient thought that it was 1am when PT told him the time. Oriented patient to time, place, and date. 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 Encouraged patient to look out the window to determine a relative time of day and to use the clock to tell the time. Patient with calendar in his room and [facility] for [facility] oriented to these signs and educated on making sure his calendar was updated daily.   Patient reported urgency for a BM, and patient was immediately incontinent following his verbal warning. Patient performed rolling R/L with min A with use of bed rail to perform peri-care and change his incontinence brief with total A. Barrier cream applied for improved skin integrity following peri-care. Patient very fatigued  after and requested to rest due to lack of sleep. Patient incontinent of bowl again and performed bed mobility with supervision with cues for pushing with his opposite eleg and turning his head in the direction of the roll and peri-care and brief  change with total A, as above.   Patient in bed at end of session with breaks locked, bed alarm set, and all needs within reach. Discussed frequency and urgency of BMs with LPN and charge nurse after session.   Session 2: Patient in bed with his wife at bedside upon PT arrival. Patient awake, but drowsy, closing his eyes intermittently, during session. Updated patient and his wife on patient's limited progress with PT and barriers to progress. Informed his wife that the medical team is aware and working on solutions for the patient's bowl incontinence. Discussed weekly goals for improved activity tolerance and strategies for management of orthostatic hypotension, gaze stabilization, and improved sleep quality. Patient and his wife appreciative of updates and education. Patient asleep at end of discussion. Patient missed 15 min of skilled PT due to fatigue, RN made aware. Will attempt to make-up missed time as able.    Patient in bed with his wife at bedside and LPN entering the room at end of session with breaks locked, bed alarm set, and all needs within reach.   Therapy Documentation Precautions:  Precautions Precautions: Fall Precaution Comments: G-tube, fall, L 5th digit WBAT, sinus precautions Restrictions Weight Bearing Restrictions: No LUE Weight Bearing: Weight bearing as tolerated Agitated Behavior Scale: TBI Observation Details Observation Environment: pt room Start of observation period - Date: 06/12/21 Start of observation period - Time: 1300 End of observation period - Date: 06/12/21 End of observation period - Time: 1400 Agitated Behavior Scale (DO NOT LEAVE BLANKS) Short attention span, easy distractibility, inability to concentrate: Present to a slight degree Impulsive, impatient, low tolerance for pain or frustration: Absent Uncooperative, resistant to care, demanding: Present to a slight degree Violent and/or threatening violence toward people or property:  Absent Explosive and/or unpredictable anger: Absent Rocking, rubbing, moaning, or other self-stimulating behavior: Absent Pulling at tubes, restraints, etc.: Absent Wandering from treatment areas: Absent Restlessness, pacing, excessive movement: Absent Repetitive behaviors, motor, and/or verbal: Present to a slight degree Rapid, loud, or excessive talking: Absent Sudden changes of mood: Present to a slight degree Easily initiated or excessive crying and/or laughter: Present to a slight degree Self-abusiveness, physical and/or verbal: Absent Agitated behavior scale total score: 19 Agitated Behavior Scale: TBI Observation Details Observation Environment: pt room Start of observation period - Date: 06/12/21 Start of observation period - Time: 1515 End of observation period - Date: 06/12/21 End of observation period - Time: 1530 Agitated Behavior Scale (DO NOT LEAVE BLANKS) Short attention span, easy distractibility, inability to concentrate: Present to a slight degree Impulsive, impatient, low tolerance for pain or frustration: Absent Uncooperative, resistant to care, demanding: Absent Violent and/or threatening violence toward people or property: Absent Explosive and/or unpredictable anger: Absent Rocking, rubbing, moaning, or other self-stimulating behavior: Absent Pulling at tubes, restraints, etc.: Absent Wandering from treatment areas: Absent Restlessness, pacing, excessive movement: Absent Repetitive behaviors, motor, and/or verbal: Absent Rapid, loud, or excessive talking: Absent Sudden changes of mood: Absent Easily initiated or excessive crying and/or laughter: Absent Self-abusiveness, physical and/or verbal: Absent Agitated behavior scale total score: 15   Therapy/Group: Individual Therapy  Makalah Asberry L Ekansh Sherk PT, DPT  06/12/2021, 4:46 PM

## 2021-06-12 NOTE — Progress Notes (Signed)
This nurse notified by PT that patient is continuously having thin, watery bowel movements over the weekend. This nurse viewed chart and notified Md on call of leukocytosis and possible need for stool specimen for Cdiff consistent with Md progress note on 06/10/21. Patient positive for Cdiff on 6/3- 6/13  treated with full course of  Fidaxomicin. New order received for loperamide 2 mg Q 4 hr PRN, Increased frequency of Fibercon 625 mg tablet to 3x daily and order for possible cdiff screen this nurse notified Charge nurse of need for CDiff specimen collection,and informed of  reason that protocol prevented collection on the weekend this nurse told to pass on to night shift nurse to ensure weekday oversight of Cdiff protocol. Patient placed on enteric precaution increased from contact. And this nurse will notify oncoming nurse of possible Cdiff patient.

## 2021-06-12 NOTE — Progress Notes (Signed)
Hypoglycemic Event  CBG: 69  Treatment: 4 oz of Juice    Symptoms: Hungry  Follow-up CBG: Time:1810 CBG Result:79  Possible Reasons for Event: Inadequate meal intake  Comments/MD notified: Dr Jerilee Field

## 2021-06-12 NOTE — Progress Notes (Signed)
Occupational Therapy TBI Note  Patient Details  Name: John Bryan MRN: [id number] Date of Birth: 1951-04-29  Today's Date: 06/12/2021 OT Individual Time: 0786-7544 OT Individual Time Calculation (min): 48 min  and Today's Date: 06/12/2021 OT Missed Time: 12 Minutes Missed Time Reason: Patient unwilling/refused to participate without medical reason   Short Term Goals: Week 1:  OT Short Term Goal 1 (Week 1): Pt will tolerate EOB ADLs with no supine rest breaks to increase functional activity tolerance OT Short Term Goal 2 (Week 1): Pt will don pants with max A OT Short Term Goal 3 (Week 1): Pt will complete sit > stand for toileting tasks with max A OT Short Term Goal 4 (Week 1): Pt will demonstrate improved intellectual awareness of deficits with mod cueing  Skilled Therapeutic Interventions/Progress Updates:     Pt received in bed with unrated pain in head. Rest and LPN provided meds for pain relief. Pt adamant night was horrible and wouldn't be able to complete ADLs at EOB because, "it makes me too dizzy and nauseous." Pt with visible L beat nystagmus and poor eye alignment seated in semi fowlers.   ADL:  Pt completes bathing with A to complete buttocks, back and R foot. Pt able to cross into supported figure 4 long sitting with LUE to wash LLE Pt completes UB dressing with A to fasten gown in the back Pt completes LB dressing with total A rolling to change brief. MIN A for rolling facilitation Pt completes footwear with MOD A to doff and don non skid socks Pt completes toileting with total A for brief management in bed pan toileting attempt as pt declines OOB toileting attempt Tape applied to glasses as temporary patch d/t diplopia during functional tasks.  Pt left at end of session in bed with exit alarm on, call light in reach and all needs met. Pt stating at en of session, I cant do any more this is just too much. Pt missed 12 min skilled OT   Therapy  Documentation Precautions:  Precautions Precautions: Fall Precaution Comments: G-tube, fall, L 5th digit WBAT, sinus precautions Restrictions Weight Bearing Restrictions: No LUE Weight Bearing: Weight bearing as tolerated General:   Vital Signs: Therapy Vitals Temp: 98.1 F (36.7 C) Temp Source: Oral Pulse Rate: 71 Resp: 20 BP: (!) 165/85 Patient Position (if appropriate): Lying Oxygen Therapy SpO2: 100 % O2 Device: Room Air Pain: Pain Assessment Pain Scale: 0-10 Pain Score: Asleep Agitated Behavior Scale: TBI  Observation Details Observation Environment: pt room Start of observation period - Date: 06/12/21 Start of observation period - Time: 1000 End of observation period - Date: 06/12/21 End of observation period - Time: 1100 Agitated Behavior Scale (DO NOT LEAVE BLANKS) Short attention span, easy distractibility, inability to concentrate: Present to a slight degree Impulsive, impatient, low tolerance for pain or frustration: Present to a slight degree Uncooperative, resistant to care, demanding: Present to a slight degree Violent and/or threatening violence toward people or property: Absent Explosive and/or unpredictable anger: Absent Rocking, rubbing, moaning, or other self-stimulating behavior: Absent Pulling at tubes, restraints, etc.: Absent Wandering from treatment areas: Absent Restlessness, pacing, excessive movement: Absent Repetitive behaviors, motor, and/or verbal: Present to a slight degree Rapid, loud, or excessive talking: Present to a slight degree Sudden changes of mood: Present to a slight degree Easily initiated or excessive crying and/or laughter: Absent Self-abusiveness, physical and/or verbal: Absent Agitated behavior scale total score: 20  ADL: ADL Eating: NPO Grooming: Contact guard Where Assessed-Grooming: [employer]  of bed Upper Body Bathing: Minimal assistance Where Assessed-Upper Body Bathing: Edge of bed Lower Body Bathing: Maximal  assistance Where Assessed-Lower Body Bathing: Bed level Upper Body Dressing: Minimal assistance Where Assessed-Upper Body Dressing: Edge of bed Lower Body Dressing: Dependent Where Assessed-Lower Body Dressing: Bed level Toileting: Dependent Where Assessed-Toileting: Bed level Toilet Transfer: Unable to assess Tub/Shower Transfer: Unable to assess Vision   Perception    Praxis   Exercises:   Other Treatments:     Therapy/Group: Individual Therapy  Tonny Branch 06/12/2021, 6:49 AM

## 2021-06-13 DIAGNOSIS — I10 Essential (primary) hypertension: Secondary | ICD-10-CM | POA: Diagnosis not present

## 2021-06-13 DIAGNOSIS — S069X3S Unspecified intracranial injury with loss of consciousness of 1 hour to 5 hours 59 minutes, sequela: Secondary | ICD-10-CM | POA: Diagnosis not present

## 2021-06-13 DIAGNOSIS — E119 Type 2 diabetes mellitus without complications: Secondary | ICD-10-CM | POA: Diagnosis not present

## 2021-06-13 DIAGNOSIS — R1312 Dysphagia, oropharyngeal phase: Secondary | ICD-10-CM | POA: Diagnosis not present

## 2021-06-13 LAB — CBC
HCT: 39.4 % (ref 39.0–52.0)
Hemoglobin: 12.9 g/dL — ABNORMAL LOW (ref 13.0–17.0)
MCH: 29.7 pg (ref 26.0–34.0)
MCHC: 32.7 g/dL (ref 30.0–36.0)
MCV: 90.6 fL (ref 80.0–100.0)
Platelets: 261 10*3/uL (ref 150–400)
RBC: 4.35 MIL/uL (ref 4.22–5.81)
RDW: 14.2 % (ref 11.5–15.5)
WBC: 6.4 10*3/uL (ref 4.0–10.5)
nRBC: 0 % (ref 0.0–0.2)

## 2021-06-13 LAB — GLUCOSE, CAPILLARY
Glucose-Capillary: 126 mg/dL — ABNORMAL HIGH (ref 70–99)
Glucose-Capillary: 131 mg/dL — ABNORMAL HIGH (ref 70–99)
Glucose-Capillary: 138 mg/dL — ABNORMAL HIGH (ref 70–99)
Glucose-Capillary: 302 mg/dL — ABNORMAL HIGH (ref 70–99)

## 2021-06-13 LAB — PREALBUMIN: Prealbumin: 12.1 mg/dL — ABNORMAL LOW (ref 18–38)

## 2021-06-13 LAB — BASIC METABOLIC PANEL
Anion gap: 7 (ref 5–15)
BUN: 17 mg/dL (ref 8–23)
CO2: 23 mmol/L (ref 22–32)
Calcium: 8.5 mg/dL — ABNORMAL LOW (ref 8.9–10.3)
Chloride: 100 mmol/L (ref 98–111)
Creatinine, Ser: 0.93 mg/dL (ref 0.61–1.24)
GFR, Estimated: >60 mL/min (ref >60)
Glucose, Bld: 173 mg/dL — ABNORMAL HIGH (ref 70–99)
Potassium: 3.5 mmol/L (ref 3.5–5.1)
Sodium: 130 mmol/L — ABNORMAL LOW (ref 135–145)

## 2021-06-13 MED ORDER — ENSURE ENLIVE PO LIQD
237.0000 mL | Freq: Three times a day (TID) | ORAL | Status: DC
  Administered 2021-06-13 – 2021-06-14 (×3): 237 mL via ORAL

## 2021-06-13 MED ORDER — CHOLESTYRAMINE 4 G PO PACK
4.0000 g | PACK | Freq: Three times a day (TID) | ORAL | Status: DC
  Administered 2021-06-13 – 2021-06-26 (×39): 4 g
  Filled 2021-06-13 (×40): qty 1

## 2021-06-13 NOTE — Progress Notes (Signed)
Physical Therapy TBI Note  Patient Details  Name: John Bryan MRN: [id number] Date of Birth: March 21, 1951  Today's Date: 06/13/2021 PT Individual Time: 0750-0900 PT Individual Time Calculation (min): 70 min   Short Term Goals: Week 1:  PT Short Term Goal 1 (Week 1): Pt will transfer with mod assist  and LRAD to WC PT Short Term Goal 1 - Progress (Week 1): Progressing toward goal PT Short Term Goal 2 (Week 1): Pt will tolerate sitting in WC>2 hours between therapies PT Short Term Goal 2 - Progress (Week 1): Progressing toward goal PT Short Term Goal 3 (Week 1): Pt will ambulate 74ft with mod assist and LRAD PT Short Term Goal 3 - Progress (Week 1): Progressing toward goal Week 2:  PT Short Term Goal 1 (Week 2): Pt will transfer with mod assist  and LRAD to WC PT Short Term Goal 2 (Week 2): Pt will tolerate sitting in WC 1 hour between therapies PT Short Term Goal 3 (Week 2): Pt will ambulate 20ft with mod assist and LRAD Week 3:     Skilled Therapeutic Interventions/Progress Updates:    Awake, alert, agreeable to session.   Incontinent of urine, bed/brief saturated w/uring.  Pt rolls L/R mult times w/min assist, cues for total assist brief change, perineal care,  mod assist for donning pants. Partial brige + roll to raise pants w/mod assist. Teds and socks donned by therapist.  No c/o dizzyness during session today.  Glasses also utilized. Supine to side to sit w/min assist, multimodal cues for sequencing, additional time. Dons clean shirt and bathrobe w/set up and min assist. Sit to stand w/cues for hand placment, mod assist. stand pivot transfer to wc w/RW and min assist, cues for sequencing, cue for safety, additional time. Pt transported to gym for continued session.  Requires frequent extended rest breaks due to poor endurance.  Sit to stand from wc w/mod to max assist, cues for sequencing, cues for scooting Gait 12 ft w/RW w/min assist, very slow cadence, very short step  length bilat, cues for posture, cues to coordinate advancement of walker, close wc follow for safety.   Stand to sit w/min to mod assist to control descent, cues for hand placement. Pt required several min rest between efforts due to fatigue.  Provided frequently w/water per pt request.  Sit to stand from wc w/max assist, cues for sequencing/safety/hand placment.  Gait 36ft w/RW, assist as above, pt incontinent of bowels, returns to sitting w/mod assist for safety.  Transported to room.  Sit to stand w/mod assist from wc to RW.  Stood x 2 -3 min w/min assist while NT assisted w/hygiene and brief change.  Pivot to bed w/max cues for sequencing/safety, min assist.  Pt handed off to NT for completion of care as session timed out.   Therapy Documentation Precautions:  Precautions Precautions: Fall Precaution Comments: G-tube, fall, L 5th digit WBAT, sinus precautions Restrictions Weight Bearing Restrictions: No LUE Weight Bearing: Weight bearing as tolerated  Pain: Pain Assessment Pain Scale: 0-10 Pain Score: 3  Pain Type: Acute pain Pain Location: Head Pain Descriptors / Indicators: Headache Pain Intervention(s): Distraction Agitated Behavior Scale: TBI Observation Details Observation Environment: Patient's room Start of observation period - Date: 06/13/21 Start of observation period - Time: 0900 End of observation period - Date: 06/13/21 End of observation period - Time: 0940 Agitated Behavior Scale (DO NOT LEAVE BLANKS) Short attention span, easy distractibility, inability to concentrate: Present to a slight degree Impulsive, impatient, low  tolerance for pain or frustration: Absent Uncooperative, resistant to care, demanding: Absent Violent and/or threatening violence toward people or property: Absent Explosive and/or unpredictable anger: Absent Rocking, rubbing, moaning, or other self-stimulating behavior: Absent Pulling at tubes, restraints, etc.: Absent Wandering from  treatment areas: Absent Restlessness, pacing, excessive movement: Absent Repetitive behaviors, motor, and/or verbal: Absent Rapid, loud, or excessive talking: Absent Sudden changes of mood: Absent Easily initiated or excessive crying and/or laughter: Absent Self-abusiveness, physical and/or verbal: Absent Agitated behavior scale total score: 15      Therapy/Group: Individual Therapy [name], PT   [name] 06/13/2021, 12:32 PM

## 2021-06-13 NOTE — Progress Notes (Signed)
Speech Language Pathology TBI Note  Patient Details  Name: MAE CIANCI MRN: [id number] Date of Birth: Apr 10, 1951  Today's Date: 06/13/2021 SLP Individual Time: 0900-0940 SLP Individual Time Calculation (min): 40 min  Short Term Goals: Week 2: SLP Short Term Goal 1 (Week 2): Patient will demonstrate efficient mastication and complete oral clearance without overt s/s of aspiration with Min verbal cues over 2 sessions prior to upgrade. SLP Short Term Goal 2 (Week 2): Patient will demonstrate tolerance of thin liquids by minimal overt s/s of aspiration via straw with Min verbal cues for use of swallowing compensatory strategies. SLP Short Term Goal 3 (Week 2): Patient will demonstrate sustained attention to  functional tasks for durations of 3-5 minutes with mod verbal cues for redirection. SLP Short Term Goal 4 (Week 2): Patient will orient to time, place, situation using external aides with mod verbal and visual cues. SLP Short Term Goal 5 (Week 2): Patient will demonstrate awareness to errors when completing basic level problem solving tasks with mod verbal cues.  Skilled Therapeutic Interventions: Skilled treatment session focused on dysphagia and cognitive goals. Upon arrival, patient was awake in bed but disoriented in regards to place and time of day. Patient with increased emergent awareness regarding difficulty reading the clock and changes in overall cognitive functioning impacting his overall function. SLP reoriented patient with mod-max verbal and visual cues. SLP attempted to donn patient's dentures without success. They appeared ill fitting despite adhesive resulting in the patient gagging. Therefore, dentures were removed. Patient agreeable to trials of Dys. 2 textures. Patient demonstrated efficient mastication with complete oral clearance without overt s/s of aspiration. Recommend trial tray prior to upgrade. Patient left upright in bed with alarm on and all needs within reach.  Continue with current plan fo care.      Pain No/Denies Pain   Agitated Behavior Scale: TBI Observation Details Observation Environment: Patient's room Start of observation period - Date: 06/13/21 Start of observation period - Time: 0900 End of observation period - Date: 06/13/21 End of observation period - Time: 0940 Agitated Behavior Scale (DO NOT LEAVE BLANKS) Short attention span, easy distractibility, inability to concentrate: Present to a slight degree Impulsive, impatient, low tolerance for pain or frustration: Absent Uncooperative, resistant to care, demanding: Absent Violent and/or threatening violence toward people or property: Absent Explosive and/or unpredictable anger: Absent Rocking, rubbing, moaning, or other self-stimulating behavior: Absent Pulling at tubes, restraints, etc.: Absent Wandering from treatment areas: Absent Restlessness, pacing, excessive movement: Absent Repetitive behaviors, motor, and/or verbal: Absent Rapid, loud, or excessive talking: Absent Sudden changes of mood: Absent Easily initiated or excessive crying and/or laughter: Absent Self-abusiveness, physical and/or verbal: Absent Agitated behavior scale total score: 15  Therapy/Group: Individual Therapy  Danielly Ackerley 06/13/2021, 12:28 PM

## 2021-06-13 NOTE — Progress Notes (Signed)
Physical Therapy TBI Note  Patient Details  Name: John Bryan MRN: [id number] Date of Birth: December 07, 1950  Today's Date: 06/13/2021 PT Individual Time: 1530-1600 PT Individual Time Calculation (min): 30 min   Short Term Goals: Week 2:  PT Short Term Goal 1 (Week 2): Pt will transfer with mod assist  and LRAD to WC PT Short Term Goal 2 (Week 2): Pt will tolerate sitting in WC 1 hour between therapies PT Short Term Goal 3 (Week 2): Pt will ambulate 42ft with mod assist and LRAD  Skilled Therapeutic Interventions/Progress Updates:    Pt received seated in bed, agreeable with encouragement to participate in therapy session. Encouraged pt to perform standing with RW this session, pt declines due to BLE soreness from gait earlier this date. Encouraged pt to sit up to EOB to work on balance, pt states "unless I do it in the position I am in right now it's not going to happen". Pt agreeable to bed level exercises. Supine BLE strengthening therex: heel slides, hip abd with some AAROM needed for L>R. Pt reports incontinence of bowel after performing exercises. Rolling L/R with mod A for dependent pericare and brief change. Pt left seated in bed in care of NT at end of session.  Therapy Documentation Precautions:  Precautions Precautions: Fall Precaution Comments: G-tube, fall, L 5th digit WBAT, sinus precautions Restrictions Weight Bearing Restrictions: No LUE Weight Bearing: Weight bearing as tolerated Agitated Behavior Scale: TBI Observation Details Observation Environment: patient's room Start of observation period - Date: 06/13/21 Start of observation period - Time: 1530 End of observation period - Date: 06/13/21 End of observation period - Time: 1600 Agitated Behavior Scale (DO NOT LEAVE BLANKS) Short attention span, easy distractibility, inability to concentrate: Present to a slight degree Impulsive, impatient, low tolerance for pain or frustration: Absent Uncooperative, resistant  to care, demanding: Present to a slight degree Violent and/or threatening violence toward people or property: Absent Explosive and/or unpredictable anger: Absent Rocking, rubbing, moaning, or other self-stimulating behavior: Absent Pulling at tubes, restraints, etc.: Absent Wandering from treatment areas: Absent Restlessness, pacing, excessive movement: Absent Repetitive behaviors, motor, and/or verbal: Absent Rapid, loud, or excessive talking: Absent Sudden changes of mood: Absent Easily initiated or excessive crying and/or laughter: Absent Self-abusiveness, physical and/or verbal: Absent Agitated behavior scale total score: 16      Therapy/Group: Individual Therapy  Peter Congo, PT, DPT, CSRS  06/13/2021, 5:19 PM
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
## 2021-06-13 NOTE — Progress Notes (Signed)
Speech Language Pathology TBI Note  Patient Details  Name: John Bryan MRN: [id number] Date of Birth: 08/21/1951  Today's Date: 06/13/2021 SLP Individual Time: 1335-1400 SLP Individual Time Calculation (min): 25 min  Short Term Goals: Week 2: SLP Short Term Goal 1 (Week 2): Patient will demonstrate efficient mastication and complete oral clearance without overt s/s of aspiration with Min verbal cues over 2 sessions prior to upgrade. SLP Short Term Goal 2 (Week 2): Patient will demonstrate tolerance of thin liquids by minimal overt s/s of aspiration via straw with Min verbal cues for use of swallowing compensatory strategies. SLP Short Term Goal 3 (Week 2): Patient will demonstrate sustained attention to  functional tasks for durations of 3-5 minutes with mod verbal cues for redirection. SLP Short Term Goal 4 (Week 2): Patient will orient to time, place, situation using external aides with mod verbal and visual cues. SLP Short Term Goal 5 (Week 2): Patient will demonstrate awareness to errors when completing basic level problem solving tasks with mod verbal cues.  Skilled Therapeutic Interventions: Skilled treatment session focused on cognitive goals. Upon arrival, patient reported fatigue and pain in his head from a long day of therapy. RN aware. Patient reported he was incontinent of bowel and needed assistance being cleaned. Patient followed directions for bed mobility with extra time and was able to verbally direct clinician on steps to procedure like needing barrier cream. Patient requested ice water and consumed without overt s/s of aspiration. Patient independently recalled events from previous therapy sessions but appeared confused related to time. Patient left upright in bed with alarm on and all needs within reach. Continue with current plan of care.      Pain 5/10 headache RN aware, repositioning, distraction, donned glasses to reduce double vision  Agitated Behavior  Scale: TBI Observation Details Observation Environment: Patient's room Start of observation period - Date: 06/13/21 Start of observation period - Time: 1335 End of observation period - Date: 06/13/21 End of observation period - Time: 1400 Agitated Behavior Scale (DO NOT LEAVE BLANKS) Short attention span, easy distractibility, inability to concentrate: Present to a slight degree Impulsive, impatient, low tolerance for pain or frustration: Absent Uncooperative, resistant to care, demanding: Absent Violent and/or threatening violence toward people or property: Absent Explosive and/or unpredictable anger: Absent Rocking, rubbing, moaning, or other self-stimulating behavior: Absent Pulling at tubes, restraints, etc.: Absent Wandering from treatment areas: Absent Restlessness, pacing, excessive movement: Absent Repetitive behaviors, motor, and/or verbal: Absent Rapid, loud, or excessive talking: Absent Sudden changes of mood: Absent Easily initiated or excessive crying and/or laughter: Absent Self-abusiveness, physical and/or verbal: Absent Agitated behavior scale total score: 15  Therapy/Group: Individual Therapy  Zhaire Locker 06/13/2021, 2:06 PM

## 2021-06-13 NOTE — Progress Notes (Addendum)
Inpatient Diabetes Program Recommendations  AACE/ADA: New Consensus Statement on Inpatient Glycemic Control (2015)  Target Ranges:  Prepandial:   less than 140 mg/dL      Peak postprandial:   less than 180 mg/dL (1-2 hours)      Critically ill patients:  140 - 180 mg/dL   Lab Results  Component Value Date   GLUCAP 131 (H) 06/13/2021   HGBA1C 6.9 (H) 05/25/2021    Review of Glycemic Control Results for John Bryan, John Bryan (MRN [id number]) as of 06/13/2021 16:18  Ref. Range 06/13/2021 00:09 06/13/2021 06:19 06/13/2021 11:36 06/13/2021 16:03  Glucose-Capillary Latest Ref Range: 70 - 99 mg/dL 269 (H) 485 (H) 462 (H) 131 (H)   Diabetes history: Type 2 DM Outpatient Diabetes medications: Humalog 2-12 units Q6H, 70/30 40 units BID Current orders for Inpatient glycemic control: Novolog 70/30 35 units BID, Novolog 0-15 units TID Osmolite QID Ensure BID  Inpatient Diabetes Program Recommendations:   With minimal intake and restart of TF may want to consider: -Changing 70/30 to Lantus 45 units QD -Changing correction to Novolog 0-9 units Q4H if intake remains minimal  Thanks, Lujean Rave, MSN, RNC-OB Diabetes Coordinator [phone] (8a-5p)

## 2021-06-13 NOTE — Progress Notes (Signed)
Occupational Therapy Note  Patient Details  Name: ALCEE SIPOS MRN: [id number] Date of Birth: [birth year]  Today's Date: 06/13/2021 OT Missed Time: 41 Minutes Missed Time Reason: Patient fatigue  Pt greeted semi-reclined in bed. Pt reported fatigue from earlier therapy and from being changed so often 2/2 loose Bms. Pt declined participation. Pt left semi-reclined in bed with bed alarm on, call bell in reach, and needs met.   Daneen Schick Eudell Julian 06/13/2021, 2:24 PM

## 2021-06-13 NOTE — Progress Notes (Signed)
Occupational Therapy Weekly Progress Note  Patient Details  Name: John Bryan MRN: [id number] Date of Birth: 03-28-1951  Beginning of progress report period: June 04, 2021 End of progress report period: June 13, 2021   Patient has met 3 of 4 short term goals.  Pt continues to be limited by poor activity tolerance and fatigue, impacted by both generalized deconditioning but also ongoing, very frequent loose BMs, that the medical team is aware of. He is able to complete ADL transfers at max A, often as good as min A when motivated and not fatigued. He has improved in his bed mobility and UB ADLs overall as well. His visual deficits vs vestibular deficits continue to impact participation as well, as he frequently reports dizziness and nausea.   Patient continues to demonstrate the following deficits: muscle weakness, decreased cardiorespiratoy endurance, decreased visual acuity, decreased visual perceptual skills, and decreased visual motor skills, decreased initiation, decreased attention, decreased awareness, decreased problem solving, decreased safety awareness, decreased memory, and delayed processing, and decreased sitting balance, decreased standing balance, decreased postural control, and decreased balance strategies and therefore will continue to benefit from skilled OT intervention to enhance overall performance with BADL.  Patient progressing toward long term goals..  Continue plan of care.  OT Short Term Goals Week 1:  OT Short Term Goal 1 (Week 1): Pt will tolerate EOB ADLs with no supine rest breaks to increase functional activity tolerance OT Short Term Goal 1 - Progress (Week 1): Met OT Short Term Goal 2 (Week 1): Pt will don pants with max A OT Short Term Goal 2 - Progress (Week 1): Progressing toward goal OT Short Term Goal 3 (Week 1): Pt will complete sit > stand for toileting tasks with max A OT Short Term Goal 3 - Progress (Week 1): Met OT Short Term Goal 4 (Week 1): Pt  will demonstrate improved intellectual awareness of deficits with mod cueing OT Short Term Goal 4 - Progress (Week 1): Met Week 2:  OT Short Term Goal 1 (Week 2): Pt will complete transfer to [facility] with mod A OT Short Term Goal 2 (Week 2): Pt will demonstrate improved activity tolerance by participating in >120 min of therapy/day OT Short Term Goal 3 (Week 2): Pt will complete UB ADLs with set up assist only EOB     Curtis Sites 06/13/2021, 8:04 PM

## 2021-06-14 DIAGNOSIS — S069X3S Unspecified intracranial injury with loss of consciousness of 1 hour to 5 hours 59 minutes, sequela: Secondary | ICD-10-CM | POA: Diagnosis not present

## 2021-06-14 DIAGNOSIS — K909 Intestinal malabsorption, unspecified: Secondary | ICD-10-CM | POA: Diagnosis not present

## 2021-06-14 DIAGNOSIS — G44311 Acute post-traumatic headache, intractable: Secondary | ICD-10-CM | POA: Diagnosis not present

## 2021-06-14 DIAGNOSIS — I10 Essential (primary) hypertension: Secondary | ICD-10-CM | POA: Diagnosis not present

## 2021-06-14 DIAGNOSIS — R197 Diarrhea, unspecified: Secondary | ICD-10-CM

## 2021-06-14 LAB — GLUCOSE, CAPILLARY
Glucose-Capillary: 115 mg/dL — ABNORMAL HIGH (ref 70–99)
Glucose-Capillary: 247 mg/dL — ABNORMAL HIGH (ref 70–99)
Glucose-Capillary: 206 mg/dL — ABNORMAL HIGH (ref 70–99)

## 2021-06-14 LAB — BASIC METABOLIC PANEL
Anion gap: 6 (ref 5–15)
BUN: 13 mg/dL (ref 8–23)
CO2: 23 mmol/L (ref 22–32)
Calcium: 8.6 mg/dL — ABNORMAL LOW (ref 8.9–10.3)
Chloride: 104 mmol/L (ref 98–111)
Creatinine, Ser: 0.85 mg/dL (ref 0.61–1.24)
GFR, Estimated: >60 mL/min (ref >60)
Glucose, Bld: 127 mg/dL — ABNORMAL HIGH (ref 70–99)
Potassium: 3.2 mmol/L — ABNORMAL LOW (ref 3.5–5.1)
Sodium: 133 mmol/L — ABNORMAL LOW (ref 135–145)

## 2021-06-14 MED ORDER — VALPROIC ACID 250 MG/5ML PO SOLN: 125.0000 mg | Freq: Four times a day (QID) | ORAL | Status: DC

## 2021-06-14 MED ORDER — POTASSIUM CHLORIDE 20 MEQ PO PACK
40.0000 meq | PACK | Freq: Two times a day (BID) | ORAL | Status: DC
  Administered 2021-06-15 – 2021-06-26 (×23): 40 meq
  Filled 2021-06-14 (×25): qty 2

## 2021-06-14 MED ORDER — POTASSIUM CHLORIDE CRYS ER 20 MEQ PO TBCR
40.0000 meq | EXTENDED_RELEASE_TABLET | Freq: Two times a day (BID) | ORAL | Status: DC
  Administered 2021-06-14: 40 meq via ORAL
  Filled 2021-06-14: qty 2

## 2021-06-14 MED ORDER — NUTRISOURCE FIBER PO PACK
1.0000 | PACK | Freq: Two times a day (BID) | ORAL | Status: DC
  Administered 2021-06-14 – 2021-06-15 (×3): 1
  Filled 2021-06-14 (×3): qty 1

## 2021-06-14 MED ORDER — MIRTAZAPINE 15 MG PO TABS
7.5000 mg | ORAL_TABLET | Freq: Every day | ORAL | Status: DC
  Administered 2021-06-14 – 2021-06-23 (×10): 7.5 mg via ORAL
  Filled 2021-06-14 (×10): qty 1

## 2021-06-14 MED ORDER — VALPROIC ACID 250 MG/5ML PO SOLN
125.0000 mg | Freq: Four times a day (QID) | ORAL | Status: DC
  Administered 2021-06-14 – 2021-06-24 (×37): 125 mg
  Filled 2021-06-14 (×38): qty 5

## 2021-06-14 MED ORDER — DIVALPROEX SODIUM 250 MG PO DR TAB
250.0000 mg | DELAYED_RELEASE_TABLET | Freq: Two times a day (BID) | ORAL | Status: DC
  Administered 2021-06-14: 250 mg via ORAL
  Filled 2021-06-14 (×3): qty 1

## 2021-06-14 NOTE — Progress Notes (Signed)
Occupational Therapy TBI Note  Patient Details  Name: John Bryan MRN: [id number] Date of Birth: Aug 07, 1951  Today's Date: 06/14/2021 OT Individual Time: 3532-9924 OT Individual Time Calculation (min): 15 min  and Today's Date: 06/14/2021 OT Missed Time: 15 Minutes Missed Time Reason: Patient fatigue   Short Term Goals: Week 2:  OT Short Term Goal 1 (Week 2): Pt will complete transfer to [facility] with mod A OT Short Term Goal 2 (Week 2): Pt will demonstrate improved activity tolerance by participating in >120 min of therapy/day OT Short Term Goal 3 (Week 2): Pt will complete UB ADLs with set up assist only EOB  Skilled Therapeutic Interventions/Progress Updates:    Pt received supine, sleeping soundly. With tactile cueing pt awoke easily and initially attended well to OT commands. Pt completed rolling R and L with mod cueing for OT to change brief- however no incontinent BM! Initiated bed mobility to EOB, required mod A to bring LE to EOB, pt then had difficulty keeping eyes open/staying awake, stating "I feel drugged, I'm just so tired". Transfer no longer safe d/t fatigue. Pt was transferred back to supine in bed and left with all needs within reach. Bed alarm set.   Therapy Documentation Precautions:  Precautions Precautions: Fall Precaution Comments: G-tube, fall, L 5th digit WBAT, sinus precautions Restrictions Weight Bearing Restrictions: No LUE Weight Bearing: Weight bearing as tolerated    Agitated Behavior Scale: TBI Observation Details Observation Environment: patient room Start of observation period - Date: 06/14/21 Start of observation period - Time: 0930 End of observation period - Date: 06/14/21 End of observation period - Time: 1000 Agitated Behavior Scale (DO NOT LEAVE BLANKS) Short attention span, easy distractibility, inability to concentrate: Present to a slight degree Impulsive, impatient, low tolerance for pain or frustration: Present to a slight  degree Uncooperative, resistant to care, demanding: Present to a slight degree Violent and/or threatening violence toward people or property: Absent Explosive and/or unpredictable anger: Absent Rocking, rubbing, moaning, or other self-stimulating behavior: Absent Pulling at tubes, restraints, etc.: Absent Wandering from treatment areas: Absent Restlessness, pacing, excessive movement: Absent Repetitive behaviors, motor, and/or verbal: Absent Rapid, loud, or excessive talking: Absent Sudden changes of mood: Absent Easily initiated or excessive crying and/or laughter: Absent Self-abusiveness, physical and/or verbal: Absent Agitated behavior scale total score: 17    Therapy/Group: Individual Therapy  Crissie Reese 06/14/2021, 6:15 AM

## 2021-06-14 NOTE — Progress Notes (Signed)
Speech Language Pathology TBI Note  Patient Details  Name: John Bryan MRN: [id number] Date of Birth: May 18, 1951  Today's Date: 06/14/2021 SLP Individual Time: 1200-1235 SLP Individual Time Calculation (min): 35 min  Short Term Goals: Week 2: SLP Short Term Goal 1 (Week 2): Patient will demonstrate efficient mastication and complete oral clearance without overt s/s of aspiration with Min verbal cues over 2 sessions prior to upgrade. SLP Short Term Goal 2 (Week 2): Patient will demonstrate tolerance of thin liquids by minimal overt s/s of aspiration via straw with Min verbal cues for use of swallowing compensatory strategies. SLP Short Term Goal 3 (Week 2): Patient will demonstrate sustained attention to  functional tasks for durations of 3-5 minutes with mod verbal cues for redirection. SLP Short Term Goal 4 (Week 2): Patient will orient to time, place, situation using external aides with mod verbal and visual cues. SLP Short Term Goal 5 (Week 2): Patient will demonstrate awareness to errors when completing basic level problem solving tasks with mod verbal cues.  Skilled Therapeutic Interventions: Skilled treatment session focused on dysphagia and cognitive goals. Upon arrival, patient was pleasant and agreeable to lunch. Patient consumed upgraded lunch tray of Dys. 2 textures with thin liquids. Patient demonstrated mildly prolonged mastication with mild right buccal pocketing and overall oral stasis that cleared with Min verbal cues and liquid washes. Patient with one overt cough X 1, suspect due to large chunk of chicken that was not properly minced. Recommend patient upgrade to Dys. 2 textures with thin liquids with full supervision to maximize safety with PO intake. Patient demonstrated selective attention to self-feeding for ~25 minutes with Mod I. Patient left upright in bed with alarm on and all needs within reach. Continue with current plan of care.      Pain Pain Assessment Pain  Scale: 0-10 Pain Score: 0-No pain  Agitated Behavior Scale: TBI Observation Details Observation Environment: Patient's room Start of observation period - Date: 06/14/21 Start of observation period - Time: 1200 End of observation period - Date: 06/14/21 End of observation period - Time: 1235 Agitated Behavior Scale (DO NOT LEAVE BLANKS) Short attention span, easy distractibility, inability to concentrate: Absent Impulsive, impatient, low tolerance for pain or frustration: Absent Uncooperative, resistant to care, demanding: Absent Violent and/or threatening violence toward people or property: Absent Explosive and/or unpredictable anger: Absent Rocking, rubbing, moaning, or other self-stimulating behavior: Absent Pulling at tubes, restraints, etc.: Absent Wandering from treatment areas: Absent Restlessness, pacing, excessive movement: Absent Repetitive behaviors, motor, and/or verbal: Absent Rapid, loud, or excessive talking: Absent Sudden changes of mood: Absent Easily initiated or excessive crying and/or laughter: Absent Self-abusiveness, physical and/or verbal: Absent Agitated behavior scale total score: 14  Therapy/Group: Individual Therapy  Guenevere Roorda 06/14/2021, 12:56 PM
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
## 2021-06-14 NOTE — Progress Notes (Signed)
Patient ID: John Bryan, male   DOB: 1951-05-08, 69 y.o.   MRN: [id number]  SW called pt dtr John Bryan [phone]) to provide updates from team conference, and pt d/c date 8/2. She asked if there was any updates of the metal being found in pt ear and if this was contributing to his dizziness. States she was informed by a therapist that read his CT scan. SW informed will pass along. She also recalled conversation with father's girlfriend who stated GI would scope pt. SW informed that pt has orders to test pt for C-Diff, and if pt continues to have loose stool, GI will be consulted. She has been encouraged to follow-up with nursing if there are any medical questions. Also states that pt asked her if he could move to [facility] so he can be closer towards home. SW informed insurance is likely not to approve a lateral transfer. She asked if pt has not made significant progressed by d/c would it be approved. SW explained first it would be up to medical team to warrant if pt requires more time in rehab, if not, a lateral move to another IRF can be explored as it will be insurance decision to approve.  Confirms that they are leaving on Friday and will be here on Monday sometime. SW will continue to f/u with updates after team conference.   Cecile Sheerer, MSW, LCSWA Office: [phone] Cell: [phone] Fax: [phone]

## 2021-06-14 NOTE — Progress Notes (Signed)
Physical Therapy Note  Patient Details  Name: John Bryan MRN: [id number] Date of Birth: 01-02-1951 Today's Date: 06/14/2021    Patient in bed asleep upon PT arrival. Patient aroused to verbal and tactile stimulation. Patient initially began appropriate conversation with PT about his therapy schedule, when offered to assist patient out of bed, patient became agitated stating "that is not going to work," "you are becoming really annoying" and progressing to "you better just get out of here!" Patient missed 30 min of skilled PT due to agitation/refusal, RN made aware. Will attempt to make-up missed time as able.    Agitated Behavior Scale: TBI Observation Details Observation Environment: Patient room Start of observation period - Date: 06/14/21 Start of observation period - Time: 1140 End of observation period - Date: 06/14/21 End of observation period - Time: 1145 Agitated Behavior Scale (DO NOT LEAVE BLANKS) Short attention span, easy distractibility, inability to concentrate: Present to a slight degree Impulsive, impatient, low tolerance for pain or frustration: Present to a moderate degree Uncooperative, resistant to care, demanding: Present to an extreme degree Violent and/or threatening violence toward people or property: Present to a slight degree Explosive and/or unpredictable anger: Present to an extreme degree Rocking, rubbing, moaning, or other self-stimulating behavior: Absent Pulling at tubes, restraints, etc.: Absent Wandering from treatment areas: Absent Restlessness, pacing, excessive movement: Absent Repetitive behaviors, motor, and/or verbal: Present to a slight degree Rapid, loud, or excessive talking: Present to a moderate degree Sudden changes of mood: Present to an extreme degree Easily initiated or excessive crying and/or laughter: Absent Self-abusiveness, physical and/or verbal: Absent Agitated behavior scale total score: 30   Garwood Wentzell L Kashmere Staffa PT,  DPT  06/14/2021, 11:58 AM
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
## 2021-06-15 ENCOUNTER — Other Ambulatory Visit (HOSPITAL_COMMUNITY): Payer: Self-pay

## 2021-06-15 DIAGNOSIS — R197 Diarrhea, unspecified: Secondary | ICD-10-CM | POA: Diagnosis not present

## 2021-06-15 DIAGNOSIS — R1312 Dysphagia, oropharyngeal phase: Secondary | ICD-10-CM | POA: Diagnosis not present

## 2021-06-15 DIAGNOSIS — E119 Type 2 diabetes mellitus without complications: Secondary | ICD-10-CM | POA: Diagnosis not present

## 2021-06-15 DIAGNOSIS — I10 Essential (primary) hypertension: Secondary | ICD-10-CM | POA: Diagnosis not present

## 2021-06-15 DIAGNOSIS — S069X3S Unspecified intracranial injury with loss of consciousness of 1 hour to 5 hours 59 minutes, sequela: Secondary | ICD-10-CM | POA: Diagnosis not present

## 2021-06-15 LAB — BASIC METABOLIC PANEL
Anion gap: 6 (ref 5–15)
BUN: 11 mg/dL (ref 8–23)
CO2: 23 mmol/L (ref 22–32)
Calcium: 8.5 mg/dL — ABNORMAL LOW (ref 8.9–10.3)
Chloride: 104 mmol/L (ref 98–111)
Creatinine, Ser: 0.79 mg/dL (ref 0.61–1.24)
GFR, Estimated: >60 mL/min (ref >60)
Glucose, Bld: 114 mg/dL — ABNORMAL HIGH (ref 70–99)
Potassium: 3.5 mmol/L (ref 3.5–5.1)
Sodium: 133 mmol/L — ABNORMAL LOW (ref 135–145)

## 2021-06-15 LAB — GLUCOSE, CAPILLARY
Glucose-Capillary: 112 mg/dL — ABNORMAL HIGH (ref 70–99)
Glucose-Capillary: 119 mg/dL — ABNORMAL HIGH (ref 70–99)
Glucose-Capillary: 186 mg/dL — ABNORMAL HIGH (ref 70–99)
Glucose-Capillary: 220 mg/dL — ABNORMAL HIGH (ref 70–99)
Glucose-Capillary: 95 mg/dL (ref 70–99)

## 2021-06-15 MED ORDER — VANCOMYCIN HCL 125 MG PO CAPS: 125.0000 mg | ORAL_CAPSULE | Freq: Every day | ORAL | Status: DC

## 2021-06-15 MED ORDER — VANCOMYCIN HCL 125 MG PO CAPS: 125.0000 mg | ORAL_CAPSULE | ORAL | Status: DC

## 2021-06-15 MED ORDER — VANCOMYCIN HCL 125 MG PO CAPS: 125.0000 mg | ORAL_CAPSULE | Freq: Four times a day (QID) | ORAL | Status: DC

## 2021-06-15 MED ORDER — VANCOMYCIN HCL 125 MG PO CAPS: 125.0000 mg | ORAL_CAPSULE | Freq: Two times a day (BID) | ORAL | Status: DC

## 2021-06-15 MED ORDER — VANCOMYCIN HCL 125 MG PO CAPS
125.0000 mg | ORAL_CAPSULE | Freq: Four times a day (QID) | ORAL | Status: DC
  Administered 2021-06-16: 125 mg via ORAL
  Filled 2021-06-15 (×2): qty 1

## 2021-06-15 MED ORDER — PSYLLIUM 95 % PO PACK
1.0000 | PACK | Freq: Two times a day (BID) | ORAL | Status: DC
  Administered 2021-06-15 – 2021-07-05 (×37): 1 via ORAL
  Filled 2021-06-15 (×42): qty 1

## 2021-06-15 MED ORDER — VANCOMYCIN HCL 125 MG PO CAPS
125.0000 mg | ORAL_CAPSULE | ORAL | Status: DC
Start: 2021-07-22 — End: 2021-06-15

## 2021-06-15 NOTE — Progress Notes (Signed)
+/-   sleep. 2 -incontinent liquid stools this shift. PRN imodium given at 0602. PRN oxy IR 5mg 's given at 0607 for complaint of HA. 0608 A

## 2021-06-15 NOTE — Progress Notes (Signed)
SLP Cancellation Note  Patient Details Name: John Bryan MRN: [id number] DOB: 06/10/1951   Cancelled treatment:       Patient missed 45 minutes of skilled SLP intervention due to fatigue. SLP provided verbal, tactile and environmental modifications with minimal success. Patient would open his eyes for ~5 seconds and remained nonverbal. Will re-attempt as able. Continue with current plan of care.                                                                                                 Carmel Garfield 06/15/2021, 1:36 PM

## 2021-06-15 NOTE — Progress Notes (Signed)
Occupational Therapy TBI Note  Patient Details  Name: John Bryan MRN: [id number] Date of Birth: 09/29/1951  Today's Date: 06/15/2021 OT Individual Time: 1219-7588 OT Individual Time Calculation (min): 60 min    Short Term Goals: Week 2:  OT Short Term Goal 1 (Week 2): Pt will complete transfer to [facility] with mod A OT Short Term Goal 2 (Week 2): Pt will demonstrate improved activity tolerance by participating in >120 min of therapy/day OT Short Term Goal 3 (Week 2): Pt will complete UB ADLs with set up assist only EOB  Skilled Therapeutic Interventions/Progress Updates:    Pt received supine eating breakfast with NT present supervising. Pt doing great eating, no s/s of aspiration and managing bite size appropriately. Pt was also able to attend to meal with no cueing while NT and OT conversed. Pt reported he had urinated and reported urgency was a main factor in continence. Pt completed anterior peri hygiene with set up assist while supine, then required assist to don incontinence brief while he rolled R and L with CGA. Once brief was on, initiated UB bathing and pt reported "oh no it's coming" and then had incontinent BM. 3 more episodes of BM incontinence- very loose while Ot performed peri hygiene and brief change. He was able to void a very small amount on the bedpan. Cream applied to pt's bottom. Pt completed UB dressing, changing gown with min A. Pt was left supine with all needs met.   Therapy Documentation Precautions:  Precautions Precautions: Fall Precaution Comments: G-tube, fall, L 5th digit WBAT, sinus precautions Restrictions Weight Bearing Restrictions: No LUE Weight Bearing: Weight bearing as tolerated  Agitated Behavior Scale: TBI Observation Details Observation Environment: Patient room Start of observation period - Date: 06/15/21 Start of observation period - Time: 0830 End of observation period - Date: 06/15/21 End of observation period - Time: 0930 Agitated  Behavior Scale (DO NOT LEAVE BLANKS) Short attention span, easy distractibility, inability to concentrate: Present to a slight degree Impulsive, impatient, low tolerance for pain or frustration: Present to a slight degree Uncooperative, resistant to care, demanding: Present to a moderate degree Violent and/or threatening violence toward people or property: Present to a moderate degree Explosive and/or unpredictable anger: Present to a slight degree Rocking, rubbing, moaning, or other self-stimulating behavior: Absent Pulling at tubes, restraints, etc.: Absent Wandering from treatment areas: Absent Restlessness, pacing, excessive movement: Absent Repetitive behaviors, motor, and/or verbal: Absent Rapid, loud, or excessive talking: Absent Sudden changes of mood: Absent Easily initiated or excessive crying and/or laughter: Absent Self-abusiveness, physical and/or verbal: Absent Agitated behavior scale total score: 21    Therapy/Group: Individual Therapy  Curtis Sites 06/15/2021, 12:37 PM
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
## 2021-06-15 NOTE — TOC Benefit Eligibility Note (Signed)
Patient Advocate Encounter  Insurance verification completed.    The patient is currently admitted and upon discharge could be taking Eliquis 5 mg.  The current 30 day co-pay is, $38.00   The patient is insured through [employer]     Kanae Ignatowski, CPhT Pharmacy Patient Advocate Specialist [facility] Direct Number: [phone]  Fax: [phone]        
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
## 2021-06-15 NOTE — Progress Notes (Signed)
Physical Therapy Session Note  Patient Details  Name: John Bryan MRN: [id number] Date of Birth: 12/31/1950  Today's Date: 06/15/2021 PT Individual Time: 1000-1045 PT Individual Time Calculation (min): 45 min   Short Term Goals: Week 2:  PT Short Term Goal 1 (Week 2): Pt will transfer with mod assist  and LRAD to WC PT Short Term Goal 2 (Week 2): Pt will tolerate sitting in WC 1 hour between therapies PT Short Term Goal 3 (Week 2): Pt will ambulate 4ft with mod assist and LRAD  Skilled Therapeutic Interventions/Progress Updates:     Patient in bed upon PT arrival. Patient alert and agreeable to PT session. Patient reported 7/10 back pain with attempting to stand and sitting with HOB elevated during session, RN made aware. PT provided repositioning, rest breaks, and distraction as pain interventions throughout session.   Therapeutic Activity: Bed Mobility: Patient performed supine to/from sit with min A for lower extremity and trunk management with use of bed rail. Provided verbal cues for lod roll technique to reduce back pain and abdominal strain with mobility. Patient sat EOB with CGA progressing quickly to supervision. Performed scooting EOB x2 with supervision and cues for hand placement and forward weight shift to off-weight hips. Transfers: Patient attempted sit to stand x2 with min A, however, reported increased back pain and requested to return to lying. Provided verbal cues for hand placement.   Performed double knee to chest stretch for lumbar spine in supine with total A with over pressure for max stretch 2x30 sec.  Patient required increased time and encouragement for all mobility, frequently perseverating on concerns about frequent diarrhea and headaches requiring max cues for redirection for tasks.   GI PA arrived during session for consult. Patient missed 15 min of skilled PT due to GI consult, RN made aware. Will attempt to make-up missed time as able. Patient in bed  at end of session with breaks locked, bed alarm set, and all needs within reach.   Therapy Documentation Precautions:  Precautions Precautions: Fall Precaution Comments: G-tube, fall, L 5th digit WBAT, sinus precautions Restrictions Weight Bearing Restrictions: No LUE Weight Bearing: Weight bearing as tolerated General: PT Amount of Missed Time (min): 15 Minutes PT Missed Treatment Reason: Unavailable (Comment) (GI PA consult)    Therapy/Group: Individual Therapy  Elaine Middleton L Trampus Mcquerry PT, DPT  06/15/2021, 8:17 PM
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
## 2021-06-16 ENCOUNTER — Other Ambulatory Visit (HOSPITAL_COMMUNITY): Payer: Self-pay

## 2021-06-16 DIAGNOSIS — S069X3S Unspecified intracranial injury with loss of consciousness of 1 hour to 5 hours 59 minutes, sequela: Secondary | ICD-10-CM | POA: Diagnosis not present

## 2021-06-16 DIAGNOSIS — R1312 Dysphagia, oropharyngeal phase: Secondary | ICD-10-CM | POA: Diagnosis not present

## 2021-06-16 DIAGNOSIS — E119 Type 2 diabetes mellitus without complications: Secondary | ICD-10-CM | POA: Diagnosis not present

## 2021-06-16 DIAGNOSIS — I10 Essential (primary) hypertension: Secondary | ICD-10-CM | POA: Diagnosis not present

## 2021-06-16 DIAGNOSIS — A0472 Enterocolitis due to Clostridium difficile, not specified as recurrent: Secondary | ICD-10-CM | POA: Diagnosis not present

## 2021-06-16 LAB — GLUCOSE, CAPILLARY
Glucose-Capillary: 86 mg/dL (ref 70–99)
Glucose-Capillary: 188 mg/dL — ABNORMAL HIGH (ref 70–99)
Glucose-Capillary: 180 mg/dL — ABNORMAL HIGH (ref 70–99)

## 2021-06-16 LAB — C DIFFICILE (CDIFF) QUICK SCRN (NO PCR REFLEX)
C Diff antigen: POSITIVE — AB
C Diff interpretation: DETECTED
C Diff toxin: POSITIVE — AB

## 2021-06-16 LAB — T4, FREE: Free T4: 1.17 ng/dL — ABNORMAL HIGH (ref 0.61–1.12)

## 2021-06-16 LAB — TSH: TSH: 1.332 u[IU]/mL (ref 0.350–4.500)

## 2021-06-16 MED ORDER — VANCOMYCIN 50 MG/ML ORAL SOLUTION: 125.0000 mg | Freq: Two times a day (BID) | ORAL | Status: DC

## 2021-06-16 MED ORDER — VANCOMYCIN 50 MG/ML ORAL SOLUTION
125.0000 mg | Freq: Four times a day (QID) | ORAL | Status: DC
  Administered 2021-06-16 – 2021-06-26 (×41): 125 mg
  Filled 2021-06-16 (×44): qty 2.5

## 2021-06-16 MED ORDER — VANCOMYCIN 50 MG/ML ORAL SOLUTION: 125.0000 mg | ORAL | Status: DC

## 2021-06-16 MED ORDER — POTASSIUM CHLORIDE CRYS ER 20 MEQ PO TBCR
40.0000 meq | EXTENDED_RELEASE_TABLET | Freq: Once | ORAL | Status: AC
  Administered 2021-06-16: 40 meq via ORAL
  Filled 2021-06-16: qty 2

## 2021-06-16 MED ORDER — VANCOMYCIN 50 MG/ML ORAL SOLUTION: 125.0000 mg | Freq: Every day | ORAL | Status: DC

## 2021-06-16 MED ORDER — VANCOMYCIN 50 MG/ML ORAL SOLUTION
125.0000 mg | ORAL | Status: DC
Start: 2021-07-22 — End: 2021-06-26

## 2021-06-16 NOTE — Progress Notes (Signed)
Occupational Therapy TBI Note  Patient Details  Name: John Bryan MRN: [id number] Date of Birth: 1950-12-15  Today's Date: 06/16/2021 OT Individual Time: 9937-1696 OT Individual Time Calculation (min): 43 min    Short Term Goals: Week 1:  OT Short Term Goal 1 (Week 1): Pt will tolerate EOB ADLs with no supine rest breaks to increase functional activity tolerance OT Short Term Goal 1 - Progress (Week 1): Met OT Short Term Goal 2 (Week 1): Pt will don pants with max A OT Short Term Goal 2 - Progress (Week 1): Progressing toward goal OT Short Term Goal 3 (Week 1): Pt will complete sit > stand for toileting tasks with max A OT Short Term Goal 3 - Progress (Week 1): Met OT Short Term Goal 4 (Week 1): Pt will demonstrate improved intellectual awareness of deficits with mod cueing OT Short Term Goal 4 - Progress (Week 1): Met  Skilled Therapeutic Interventions/Progress Updates:     Pt received in bed RN present and pt does not report pain at beginning of session  ADL: Total A for hygiene after incontinent BM in brief and MIN A for rolling.  Pt completes supine>sitting EOB with MIN A for trunk elevation using bed rail from flat bed. Pt completes STS in stedy 4x during functional transfers with MIN-MOD A overall depending on surface height. Pt completes grooming at sink with glasses (1 lense occluded) for visual scanning and oral care at sink. A to rinse hair and pt washes hair at sink seated in w/c.   Pt left at end of session in bed with exit alarm on, call light in reach and all needs met   Therapy Documentation Precautions:  Precautions Precautions: Fall Precaution Comments: G-tube, fall, L 5th digit WBAT, sinus precautions Restrictions Weight Bearing Restrictions: No LUE Weight Bearing: Weight bearing as tolerated General:   Vital Signs: Therapy Vitals Temp: 98.5 F (36.9 C) Pulse Rate: 77 Resp: 18 BP: 136/80 Patient Position (if appropriate): Lying Oxygen  Therapy SpO2: 99 % Pain:   Agitated Behavior Scale: TBI  Observation Details Observation Environment: pt room Start of observation period - Date: 06/16/21 Start of observation period - Time: 0915 End of observation period - Date: 06/16/21 End of observation period - Time: 1000 Agitated Behavior Scale (DO NOT LEAVE BLANKS) Short attention span, easy distractibility, inability to concentrate: Present to a slight degree Impulsive, impatient, low tolerance for pain or frustration: Present to a slight degree Uncooperative, resistant to care, demanding: Absent Violent and/or threatening violence toward people or property: Absent Explosive and/or unpredictable anger: Absent Rocking, rubbing, moaning, or other self-stimulating behavior: Absent Pulling at tubes, restraints, etc.: Absent Wandering from treatment areas: Absent Restlessness, pacing, excessive movement: Absent Repetitive behaviors, motor, and/or verbal: Present to a slight degree Rapid, loud, or excessive talking: Present to a slight degree Sudden changes of mood: Present to a slight degree Easily initiated or excessive crying and/or laughter: Absent Self-abusiveness, physical and/or verbal: Absent Agitated behavior scale total score: 19  ADL: ADL Eating: NPO Grooming: Contact guard Where Assessed-Grooming: Edge of bed Upper Body Bathing: Minimal assistance Where Assessed-Upper Body Bathing: Edge of bed Lower Body Bathing: Maximal assistance Where Assessed-Lower Body Bathing: Bed level Upper Body Dressing: Minimal assistance Where Assessed-Upper Body Dressing: Edge of bed Lower Body Dressing: Dependent Where Assessed-Lower Body Dressing: Bed level Toileting: Dependent Where Assessed-Toileting: Bed level Toilet Transfer: Unable to assess Tub/Shower Transfer: Unable to assess Vision   Perception    Praxis   Exercises:  Other Treatments:     Therapy/Group: Individual Therapy  Tonny Branch 06/16/2021, 6:47 AM

## 2021-06-16 NOTE — Progress Notes (Signed)
Speech Language Pathology TBI Note  Patient Details  Name: John Bryan MRN: [id number] Date of Birth: 04-15-1951  Today's Date: 06/16/2021 SLP Individual Time: 1045-1100 SLP Individual Time Calculation (min): 15 min and Today's Date: 06/16/2021 SLP Missed Time: 30 Minutes Missed Time Reason: Patient fatigue  Short Term Goals: Week 2: SLP Short Term Goal 1 (Week 2): Patient will demonstrate efficient mastication and complete oral clearance without overt s/s of aspiration with Min verbal cues over 2 sessions prior to upgrade. SLP Short Term Goal 2 (Week 2): Patient will demonstrate tolerance of thin liquids by minimal overt s/s of aspiration via straw with Min verbal cues for use of swallowing compensatory strategies. SLP Short Term Goal 3 (Week 2): Patient will demonstrate sustained attention to  functional tasks for durations of 3-5 minutes with mod verbal cues for redirection. SLP Short Term Goal 4 (Week 2): Patient will orient to time, place, situation using external aides with mod verbal and visual cues. SLP Short Term Goal 5 (Week 2): Patient will demonstrate awareness to errors when completing basic level problem solving tasks with mod verbal cues.  Skilled Therapeutic Interventions: Skilled treatment session focused on cognitive goals. Upon arrival, patient was lethargic and appeared fatigued. Patient reports due to pain and fatigue due to previous therapy session and declined participation. However, patient able to answer orientation questions with use of external aids with overall extra time and Mod verbal and visual cues. Patient requested water and consumed without overt s/s of aspiration. Patient continued to decline participation and requested to sleep. Patient left supine in bed with alarm on and all needs within reach. Will re-attempt as able to make up remaining missed minutes.      Pain Reports pain in legs, patient repositioned, premedicated and rest   Agitated Behavior  Scale: TBI Observation Details Observation Environment: Patient's room Start of observation period - Date: 06/16/21 Start of observation period - Time: 1045 End of observation period - Date: 06/16/21 End of observation period - Time: 1100 Agitated Behavior Scale (DO NOT LEAVE BLANKS) Short attention span, easy distractibility, inability to concentrate: Present to a slight degree Impulsive, impatient, low tolerance for pain or frustration: Absent Uncooperative, resistant to care, demanding: Absent Violent and/or threatening violence toward people or property: Absent Explosive and/or unpredictable anger: Absent Rocking, rubbing, moaning, or other self-stimulating behavior: Absent Pulling at tubes, restraints, etc.: Absent Wandering from treatment areas: Absent Restlessness, pacing, excessive movement: Absent Repetitive behaviors, motor, and/or verbal: Absent Rapid, loud, or excessive talking: Absent Sudden changes of mood: Absent Easily initiated or excessive crying and/or laughter: Absent Self-abusiveness, physical and/or verbal: Absent Agitated behavior scale total score: 15  Therapy/Group: Individual Therapy  Koralynn Greenspan 06/16/2021, 4:24 PM

## 2021-06-16 NOTE — Progress Notes (Signed)
Physical Therapy Session Note  Patient Details  Name: John Bryan MRN: [id number] Date of Birth: November 06, 1951  Today's Date: 06/16/2021 PT Individual Time: 1300-1315 and 1415-1445 PT Individual Time Calculation (min): 15 min and 30 min   Short Term Goals: Week 2:  PT Short Term Goal 1 (Week 2): Pt will transfer with mod assist  and LRAD to WC PT Short Term Goal 2 (Week 2): Pt will tolerate sitting in WC 1 hour between therapies PT Short Term Goal 3 (Week 2): Pt will ambulate 69ft with mod assist and LRAD  Skilled Therapeutic Interventions/Progress Updates:     Session 1: Patient in bed with NT in the room upon PT arrival. Patient was incontinent of bowl upon PT arrival, reports that he had 2 BMs in a row. Encouraged patient to get OOB to allow sheets to be changed due to soiling. Patient declined, stating "you have to give me a break some time!" Assisted patient with rolling R/L to change sheets with CGA and cues for brining his top leg across to reduce assist with rolling with use of bed rails. Following sheets being changed patient requested to rest. Reports that he does all his therapy in the morning then is too tired to get OOB in the afternoon. Patient agreeable to therapist returning in 1 hour to attempt PT session following a rest break. Patient in bed with his girlfriend in the room with bed alarm set and all needs in reach at end of session.   Session 2: Patient in bed with his girlfriend in the room upon PT entry. Patient continues to report he is too tired to get OOB, but agreeable to bed level exercises. Patient denied pain during session.   Patient's girlfriend asked about patient being able to write. Patient provided with pen and paper and wrote his name x4 times. Noted to write very slowly and small with poor distinction between some letters. Patient became frustrated and asked to "move on." Informed patient that he could work on writing with OT and/or SLP in more functional  ways. Patient's girlfriend asked about the patient being able to sign papers or checks. PT discouraged this as the patient is not cognitively able to make decisions at this time, instructed her to speak with CSW, CSW informed.   Therapeutic Exercise: Patient performed the following exercises with verbal and tactile cues for proper technique. -B heel slides x10 -B ankle pumps x10 -B quad sets x10 with 5 sec hold -B SLR x10 with mod A AAROM -B hip abd/add x10 -B DF stretch 2x1 min hold  Patient in bed with his girlfriend in the room at end of session with breaks locked, bed alarm set, and all needs within reach.   Therapy Documentation Precautions:  Precautions Precautions: Fall Precaution Comments: G-tube, fall, L 5th digit WBAT, sinus precautions Restrictions Weight Bearing Restrictions: No LUE Weight Bearing: Weight bearing as tolerated    Therapy/Group: Individual Therapy  Stafford Riviera L Reg Bircher PT, DPT  06/16/2021, 5:04 PM
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
## 2021-06-16 NOTE — Progress Notes (Signed)
Daily Rounding Note  06/16/2021, 12:00 PM  LOS: 13 days   SUBJECTIVE:   Chief complaint:   Diarrhea. Diarrhea persists.  Repeat C. difficile testing is antigen and toxin positive consistent with active C. difficile.  Previous 10-day treatment with Dificid in early June.  Vancomycin day 1. Had a loose brown stool once this morning.  RN says he is eating and not complaining of abdominal pain or nausea  OBJECTIVE:         Vital signs in last 24 hours:    Temp:  [98.5 F (36.9 C)-98.8 F (37.1 C)] 98.5 F (36.9 C) (07/14 0551) Pulse Rate:  [70-77] 77 (07/14 0551) Resp:  [17-18] 18 (07/14 0551) BP: (136-151)/(71-84) 136/80 (07/14 0551) SpO2:  [99 %] 99 % (07/14 0551) Weight:  [102.7 kg] 102.7 kg (07/14 0500) Last BM Date: 06/16/21 Filed Weights   06/13/21 0600 06/14/21 0440 06/16/21 0500  Weight: 103.5 kg 102.6 kg 102.7 kg   General: Patient was resting comfortably on the bed.  I did not wake him up for exam other than to visually observe him. No labored breathing.   Intake/Output from previous day: 07/13 0701 - 07/14 0700 In: 600 [P.O.:600] Out: -   Intake/Output this shift: No intake/output data recorded.  Lab Results: No results for input(s): WBC, HGB, HCT, PLT in the last 72 hours. BMET Recent Labs    06/14/21 0503 06/15/21 0657  NA 133* 133*  K 3.2* 3.5  CL 104 104  CO2 23 23  GLUCOSE 127* 114*  BUN 13 11  CREATININE 0.85 0.79  CALCIUM 8.6* 8.5*   LFT No results for input(s): PROT, ALBUMIN, AST, ALT, ALKPHOS, BILITOT, BILIDIR, IBILI in the last 72 hours. PT/INR No results for input(s): LABPROT, INR in the last 72 hours. Hepatitis Panel No results for input(s): HEPBSAG, HCVAB, HEPAIGM, HEPBIGM in the last 72 hours.  Studies/Results: No results found.  Scheduled Meds:  apixaban  5 mg Per Tube BID   atorvastatin  20 mg Per Tube Daily   cholecalciferol  1,000 Units Per Tube Daily    cholestyramine  4 g Per Tube TID   cloNIDine  0.2 mg Per Tube TID   famotidine  20 mg Per Tube BID   feeding supplement (PROSource TF)  90 mL Per Tube BID   fluticasone  2 spray Each Nare BID WC   free water  200 mL Per Tube QID   [name]'s butt cream   Topical QID   insulin aspart  0-15 Units Subcutaneous TID WC   insulin aspart protamine- aspart  35 Units Subcutaneous BID WC   levothyroxine  75 mcg Per Tube Q0600   melatonin  3 mg Per Tube QHS   mirtazapine  7.5 mg Oral QHS   potassium chloride  40 mEq Per Tube BID   potassium chloride  40 mEq Oral Once   propranolol  40 mg Per Tube TID   psyllium  1 packet Oral BID   QUEtiapine  25 mg Oral BID   sertraline  50 mg Per Tube Daily   valproic acid  125 mg Per Tube QID   vancomycin  125 mg Per Tube QID   Followed by   Melene Muller ON 06/29/2021] vancomycin  125 mg Per Tube BID   Followed by   Melene Muller ON 07/07/2021] vancomycin  125 mg Per Tube Daily   Followed by   Melene Muller ON 07/14/2021] vancomycin  125 mg Per Tube QODAY  Followed by   Melene Muller ON 07/22/2021] vancomycin  125 mg Per Tube Q3 days   Continuous Infusions: PRN Meds:.acetaminophen, loperamide, ondansetron, ondansetron, oxyCODONE   ASSESMENT:   Diarrhea.  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  Notes from [facility] indicate he was treated 6/3-6/10/22 w Dificid.  Now day 1 of vancomycin per G-tube.   Dose is  125 qid for 2 weeks then  bid for 1 week then  once daily for 1 week then  QOD for 1 week then   Q 3 days for 1 week   PLAN   Continue with vancomycin with taper as outlined above.  GI will sign off.  Please call back if patient does not begin to turn the corner in terms of diarrhea over the next several days.    [name]  06/16/2021, 12:00 PM Phone [phone]

## 2021-06-17 ENCOUNTER — Inpatient Hospital Stay (HOSPITAL_COMMUNITY): Payer: No Typology Code available for payment source

## 2021-06-17 DIAGNOSIS — E119 Type 2 diabetes mellitus without complications: Secondary | ICD-10-CM | POA: Diagnosis not present

## 2021-06-17 DIAGNOSIS — A0472 Enterocolitis due to Clostridium difficile, not specified as recurrent: Secondary | ICD-10-CM | POA: Diagnosis not present

## 2021-06-17 DIAGNOSIS — R1312 Dysphagia, oropharyngeal phase: Secondary | ICD-10-CM | POA: Diagnosis not present

## 2021-06-17 DIAGNOSIS — S069X3S Unspecified intracranial injury with loss of consciousness of 1 hour to 5 hours 59 minutes, sequela: Secondary | ICD-10-CM | POA: Diagnosis not present

## 2021-06-17 LAB — GLUCOSE, CAPILLARY
Glucose-Capillary: 109 mg/dL — ABNORMAL HIGH (ref 70–99)
Glucose-Capillary: 158 mg/dL — ABNORMAL HIGH (ref 70–99)
Glucose-Capillary: 229 mg/dL — ABNORMAL HIGH (ref 70–99)
Glucose-Capillary: 246 mg/dL — ABNORMAL HIGH (ref 70–99)

## 2021-06-17 LAB — BASIC METABOLIC PANEL
Anion gap: 8 (ref 5–15)
BUN: 12 mg/dL (ref 8–23)
CO2: 17 mmol/L — ABNORMAL LOW (ref 22–32)
Calcium: 8.2 mg/dL — ABNORMAL LOW (ref 8.9–10.3)
Chloride: 106 mmol/L (ref 98–111)
Creatinine, Ser: 0.73 mg/dL (ref 0.61–1.24)
GFR, Estimated: >60 mL/min (ref >60)
Glucose, Bld: 183 mg/dL — ABNORMAL HIGH (ref 70–99)
Potassium: 3.9 mmol/L (ref 3.5–5.1)
Sodium: 131 mmol/L — ABNORMAL LOW (ref 135–145)

## 2021-06-17 MED ORDER — METHOCARBAMOL 500 MG PO TABS
500.0000 mg | ORAL_TABLET | Freq: Three times a day (TID) | ORAL | Status: DC
  Administered 2021-06-17 – 2021-06-24 (×20): 500 mg via ORAL
  Filled 2021-06-17 (×20): qty 1

## 2021-06-17 MED ORDER — INSULIN ASPART PROT & ASPART (70-30 MIX) 100 UNIT/ML ~~LOC~~ SUSP
38.0000 [IU] | Freq: Two times a day (BID) | SUBCUTANEOUS | Status: DC
  Administered 2021-06-17 – 2021-06-26 (×19): 38 [IU] via SUBCUTANEOUS
  Filled 2021-06-17: qty 10

## 2021-06-17 MED ORDER — SODIUM CHLORIDE 0.9 % IV SOLN: INTRAVENOUS | Status: DC

## 2021-06-17 MED ORDER — TRAMADOL HCL 50 MG PO TABS
50.0000 mg | ORAL_TABLET | Freq: Two times a day (BID) | ORAL | Status: DC
  Administered 2021-06-17 – 2021-06-18 (×2): 50 mg via ORAL
  Filled 2021-06-17 (×2): qty 1

## 2021-06-17 MED ORDER — POTASSIUM CHLORIDE 20 MEQ PO PACK
40.0000 meq | PACK | Freq: Once | ORAL | Status: AC
  Administered 2021-06-17: 40 meq via ORAL
  Filled 2021-06-17: qty 2

## 2021-06-17 NOTE — Progress Notes (Signed)
Speech Language Pathology Weekly Progress and Session Note  Patient Details  Name: John Bryan MRN: [id number] Date of Birth: 03/08/1951  Beginning of progress report period: June 10, 2021 End of progress report period: June 17, 2021  Today's Date: 06/17/2021 SLP Individual Time: 0800-0845 SLP Individual Time Calculation (min): 45 min  Short Term Goals: Week 2: SLP Short Term Goal 1 (Week 2): Patient will demonstrate efficient mastication and complete oral clearance without overt s/s of aspiration with Min verbal cues over 2 sessions prior to upgrade. SLP Short Term Goal 1 - Progress (Week 2): Met SLP Short Term Goal 2 (Week 2): Patient will demonstrate tolerance of thin liquids by minimal overt s/s of aspiration via straw with Min verbal cues for use of swallowing compensatory strategies. SLP Short Term Goal 2 - Progress (Week 2): Met SLP Short Term Goal 3 (Week 2): Patient will demonstrate sustained attention to  functional tasks for durations of 3-5 minutes with mod verbal cues for redirection. SLP Short Term Goal 3 - Progress (Week 2): Met SLP Short Term Goal 4 (Week 2): Patient will orient to time, place, situation using external aides with mod verbal and visual cues. SLP Short Term Goal 4 - Progress (Week 2): Met SLP Short Term Goal 5 (Week 2): Patient will demonstrate awareness to errors when completing basic level problem solving tasks with mod verbal cues. SLP Short Term Goal 5 - Progress (Week 2): Met    New Short Term Goals: Week 3: SLP Short Term Goal 1 (Week 3): Patient will demonstrate efficient mastication and complete oral clearance with trials of Dys. 3 textures with minimal overt s/s of aspiration over 2 sessions prior to upgrade. SLP Short Term Goal 2 (Week 3): Patient will demonstrate sustained attention to functional tasks fo 10 minutes with Mod verbal cues for redirection. SLP Short Term Goal 3 (Week 3): Patient will orient to time, place, situation using  external aides with min verbal and visual cues. SLP Short Term Goal 4 (Week 3): Patient will demonstrate awareness to errors when completing basic level problem solving tasks with min verbal cues.  Weekly Progress Updates: Patient has made functional gains and has met 4 of 4 STGs this reporting period. Currently, patient is consuming Dys. 2 textures with thin liquids with minimal overt s/s of aspiration with supervision level verbal cues for use of swallowing compensatory strategies. Trials of upgraded textures have been attempted but patient's dentures are currently loose despite adhesive resulting in patient gagging. Will re-attempt as able. Patient demonstrates behaviors consistent with a [place]o Level VI and requires overall Mod A multimodal cues to complete functional and familiar tasks safely in regards to problem solving, orientation, attention and awareness. Patient's participation can be limited by pain and fatigue at times. Patient and family education ongoing. Patient would benefit from continued skilled SLP intervention to maximize his cognitive and swallowing function prior to discharge.       Intensity: Minumum of 1-2 x/day, 30 to 90 minutes Frequency: 3 to 5 out of 7 days Duration/Length of Stay: 07/05/21 Treatment/Interventions: Cognitive remediation/compensation;Dysphagia/aspiration precaution training;Internal/external aids;Cueing hierarchy;Functional tasks;Patient/family education;Therapeutic Activities;Environmental controls   Daily Session  Skilled Therapeutic Interventions:  Skilled treatment session focused on dysphagia and cognitive goals. Upon arrival, patient was asleep in bed. Patient was easily awakened but reported poor sleep due to multiple bowel movements and pain in his back. RN aware and administered medications. Patient agreeable to breakfast meal and consumed Dys. 2 textures with thin liquids with minimal overt s/s   of aspiration and overall supervision level verbal cues  for use of swallowing compensatory strategies. Recommend patient continue current diet. Patient demonstrated increased ability to alternate attention between self-feeding and functional conversation with increased orientation to place and date. Patient also demonstrated improved ability to recall daily events including therapy sessions. Patient left upright in bed with alarm on and all needs within reach. Continue with current plan of care.      Pain Pain in back Patient repositioned and RN administered medications   Therapy/Group: Individual Therapy  ,  06/17/2021, 6:27 AM       
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
## 2021-06-17 NOTE — Progress Notes (Signed)
Occupational Therapy TBI Note  Patient Details  Name: John Bryan MRN: [id number] Date of Birth: 01-Feb-1951  Today's Date: 06/17/2021 OT Individual Time: 1005-1029 OT Individual Time Calculation (min): 24 min    Short Term Goals: Week 1:  OT Short Term Goal 1 (Week 1): Pt will tolerate EOB ADLs with no supine rest breaks to increase functional activity tolerance OT Short Term Goal 1 - Progress (Week 1): Met OT Short Term Goal 2 (Week 1): Pt will don pants with max A OT Short Term Goal 2 - Progress (Week 1): Progressing toward goal OT Short Term Goal 3 (Week 1): Pt will complete sit > stand for toileting tasks with max A OT Short Term Goal 3 - Progress (Week 1): Met OT Short Term Goal 4 (Week 1): Pt will demonstrate improved intellectual awareness of deficits with mod cueing OT Short Term Goal 4 - Progress (Week 1): Met  Skilled Therapeutic Interventions/Progress Updates:    1:1. Pt received in bed asleep. Pt requires increased time and multimodal cuing to arouse. Pt reporting poor sleep at night d/t frequent Bms and increased back pain. Resositioned for comfort for back and RN already delivered medications. Pt reporting needing to urinate. OT provided urinal, but pt already voiding in brief. Educated on importance of using urinal to attempt continence in prep for DC home/decrease skin issues. Pt rolls with S to change brief after set up for peri care. Pt refusing other tx d/t pain and fatigue. Continued education on participation in tx to improve functional outcomes. Pt missed 36 min skilled OT  Therapy Documentation Precautions:  Precautions Precautions: Fall Precaution Comments: G-tube, fall, L 5th digit WBAT, sinus precautions Restrictions Weight Bearing Restrictions: No LUE Weight Bearing: Weight bearing as tolerated General:   Vital Signs:   Pain:   Agitated Behavior Scale: TBI  Observation Details Observation Environment: Patient's room Start of observation period  - Date: 06/17/21 Start of observation period - Time: 0800 End of observation period - Date: 06/17/21 End of observation period - Time: 0845 Agitated Behavior Scale (DO NOT LEAVE BLANKS) Short attention span, easy distractibility, inability to concentrate: Present to a slight degree Impulsive, impatient, low tolerance for pain or frustration: Absent Uncooperative, resistant to care, demanding: Absent Violent and/or threatening violence toward people or property: Absent Explosive and/or unpredictable anger: Absent Rocking, rubbing, moaning, or other self-stimulating behavior: Absent Pulling at tubes, restraints, etc.: Absent Wandering from treatment areas: Absent Restlessness, pacing, excessive movement: Absent Repetitive behaviors, motor, and/or verbal: Absent Rapid, loud, or excessive talking: Absent Sudden changes of mood: Absent Easily initiated or excessive crying and/or laughter: Absent Self-abusiveness, physical and/or verbal: Absent Agitated behavior scale total score: 15  ADL: ADL Eating: NPO Grooming: Contact guard Where Assessed-Grooming: Edge of bed Upper Body Bathing: Minimal assistance Where Assessed-Upper Body Bathing: Edge of bed Lower Body Bathing: Maximal assistance Where Assessed-Lower Body Bathing: Bed level Upper Body Dressing: Minimal assistance Where Assessed-Upper Body Dressing: Edge of bed Lower Body Dressing: Dependent Where Assessed-Lower Body Dressing: Bed level Toileting: Dependent Where Assessed-Toileting: Bed level Toilet Transfer: Unable to assess Tub/Shower Transfer: Unable to assess Vision   Perception    Praxis   Exercises:   Other Treatments:     Therapy/Group: Individual Therapy  Tonny Branch 06/17/2021, 6:50 AM

## 2021-06-17 NOTE — Progress Notes (Signed)
VAST RN at bedside to place IV. Attempted to place in right forearm, but vein blew when trying to flush and patient complains of pain so IV removed. Assessed left arm for IV placement, vein visible with [place] but patient does not want IV at this time. Patient states he does not want to be stuck right now, to wait until tomorrow.   Primary RN informed.

## 2021-06-17 NOTE — Progress Notes (Signed)
Patient refusing IV at this time after unsuccessful attempt on right arm.  Willing to try again tomorrow.

## 2021-06-17 NOTE — Progress Notes (Signed)
Physical Therapy TBI Note  Patient Details  Name: John Bryan MRN: [id number] Date of Birth: [birth year]  Today's Date: 06/17/2021 PT Individual Time: 1300-1345 PT Individual Time Calculation (min): 45 min   Short Term Goals: Week 2:  PT Short Term Goal 1 (Week 2): Pt will transfer with mod assist  and LRAD to WC PT Short Term Goal 2 (Week 2): Pt will tolerate sitting in WC 1 hour between therapies PT Short Term Goal 3 (Week 2): Pt will ambulate 26ft with mod assist and LRAD  Skilled Therapeutic Interventions/Progress Updates:     Patient in bed upon PT arrival. Patient reported 9/10 low back pain during session, RN made aware. PT provided repositioning, rest breaks, and distraction as pain interventions throughout session. Patient declined OOB mobility or bed level exercises due to increased back pain, agreeable to assessment of his back and treatment with pain interventions.   Patient reports significant hx of low back pain since an injury in the [employer]. Stated that he receives Cortizone shots every 3-4 months, MD made aware. Patient with moderate edema and muscle tightness over paraspinals at lumbar region with tenderness from L1-5 with significant increase in pain to palpation at L2-3. Reports radicular pain to his sacrum and proximal legs intermittently. States that has a flexion preference (feels worse in standing and lying flat).   Patient rolled to L side-lying with min A and cues for log roll technique to management back pain. Remained in L side-lying ~20 min at PT performed soft tissue mobilization and trigger point release to paraspinals and gluteals for reduced muscle tension and pain management. Provided effleurage and lymphatic massage for edema control.   Patient performed double knees to chest in side-lying for lumbar stretch with mild relief. Patient then incontinent of bowl and bladder. Performed rolling with min A, lower body clothing management and peri-care with total A  to clean incontinent episode. Provided cues for log roll technique to protect his back during rolling.   Educated on reduced pulling on rails in the bed to protect his back and encouraged OOB mobility tomorrow to get him off his back, patient tentatively in agreement.  Patient in bed at end of session with breaks locked, bed alarm set, and all needs within reach.   Therapy Documentation Precautions:  Precautions Precautions: Fall Precaution Comments: G-tube, fall, L 5th digit WBAT, sinus precautions Restrictions Weight Bearing Restrictions: No LUE Weight Bearing: Weight bearing as tolerated Agitated Behavior Scale: TBI Observation Details Observation Environment: pt room Start of observation period - Date: 06/17/21 Start of observation period - Time: 1300 End of observation period - Date: 06/17/21 End of observation period - Time: 1345 Agitated Behavior Scale (DO NOT LEAVE BLANKS) Short attention span, easy distractibility, inability to concentrate: Present to a slight degree Impulsive, impatient, low tolerance for pain or frustration: Present to a slight degree Uncooperative, resistant to care, demanding: Present to a slight degree Violent and/or threatening violence toward people or property: Absent Explosive and/or unpredictable anger: Absent Rocking, rubbing, moaning, or other self-stimulating behavior: Absent Pulling at tubes, restraints, etc.: Absent Wandering from treatment areas: Absent Restlessness, pacing, excessive movement: Absent Repetitive behaviors, motor, and/or verbal: Absent Rapid, loud, or excessive talking: Absent Sudden changes of mood: Absent Easily initiated or excessive crying and/or laughter: Absent Self-abusiveness, physical and/or verbal: Absent Agitated behavior scale total score: 17     Therapy/Group: Individual Therapy  Kaylena Pacifico L Marybel Alcott PT, DPT  06/17/2021, 4:40 PM

## 2021-06-18 DIAGNOSIS — A0472 Enterocolitis due to Clostridium difficile, not specified as recurrent: Secondary | ICD-10-CM | POA: Diagnosis not present

## 2021-06-18 DIAGNOSIS — S069X3S Unspecified intracranial injury with loss of consciousness of 1 hour to 5 hours 59 minutes, sequela: Secondary | ICD-10-CM | POA: Diagnosis not present

## 2021-06-18 LAB — GLUCOSE, CAPILLARY
Glucose-Capillary: 156 mg/dL — ABNORMAL HIGH (ref 70–99)
Glucose-Capillary: 174 mg/dL — ABNORMAL HIGH (ref 70–99)
Glucose-Capillary: 179 mg/dL — ABNORMAL HIGH (ref 70–99)
Glucose-Capillary: 82 mg/dL (ref 70–99)
Glucose-Capillary: 97 mg/dL (ref 70–99)

## 2021-06-18 LAB — BASIC METABOLIC PANEL
Anion gap: 4 — ABNORMAL LOW (ref 5–15)
BUN: 11 mg/dL (ref 8–23)
CO2: 20 mmol/L — ABNORMAL LOW (ref 22–32)
Calcium: 8.7 mg/dL — ABNORMAL LOW (ref 8.9–10.3)
Chloride: 112 mmol/L — ABNORMAL HIGH (ref 98–111)
Creatinine, Ser: 0.77 mg/dL (ref 0.61–1.24)
GFR, Estimated: >60 mL/min (ref >60)
Glucose, Bld: 94 mg/dL (ref 70–99)
Potassium: 4.1 mmol/L (ref 3.5–5.1)
Sodium: 136 mmol/L (ref 135–145)

## 2021-06-18 MED ORDER — TRAMADOL HCL 50 MG PO TABS
100.0000 mg | ORAL_TABLET | Freq: Two times a day (BID) | ORAL | Status: DC
  Administered 2021-06-18 – 2021-06-24 (×12): 100 mg via ORAL
  Filled 2021-06-18 (×12): qty 2

## 2021-06-18 MED ORDER — PHENAZOPYRIDINE HCL 100 MG PO TABS
100.0000 mg | ORAL_TABLET | Freq: Three times a day (TID) | ORAL | Status: DC
  Filled 2021-06-18: qty 1

## 2021-06-18 NOTE — Progress Notes (Signed)
Occupational Therapy TBI Note  Patient Details  Name: John Bryan MRN: [id number] Date of Birth: 16-Oct-1951   Today's Date: 06/18/2021 OT Individual Time: 1030-1110 OT Individual Time Calculation (min): 40 min   Short Term Goals: Week 1:  OT Short Term Goal 1 (Week 1): Pt will tolerate EOB ADLs with no supine rest breaks to increase functional activity tolerance OT Short Term Goal 1 - Progress (Week 1): Met OT Short Term Goal 2 (Week 1): Pt will don pants with max A OT Short Term Goal 2 - Progress (Week 1): Progressing toward goal OT Short Term Goal 3 (Week 1): Pt will complete sit > stand for toileting tasks with max A OT Short Term Goal 3 - Progress (Week 1): Met OT Short Term Goal 4 (Week 1): Pt will demonstrate improved intellectual awareness of deficits with mod cueing OT Short Term Goal 4 - Progress (Week 1): Met  Skilled Therapeutic Interventions/Progress Updates:    1;1. Pt received in bed agreeable to OT. Pt with pain in back and providedtemorary heat pack for back pain at end of session. Pt completes supine>sit with MIN A overall and stand pivot transfer with RW from elevated surface with MOD A to power up and CGA to pivot to w/c with VC for turning to seat/reach back. Pt completes grooming at sink with supervision and increased time. Pt states, "I hope this cushion is water proof. Im peeing." Continued education on timed toileting and use of urinal and BSC to improve OOB mobility/continence in prep for DC. Pt stands at sink with MOD A to power up and CGA while OT changes brief. Returned to bed with RW via stand pivot as stated above. Tacile cues to bring Les into bed with min elevation A. Exited session with pt seated in bed, exit alarm on and call light in reach   Therapy Documentation Precautions:  Precautions Precautions: Fall Precaution Comments: G-tube, fall, L 5th digit WBAT, sinus precautions Restrictions Weight Bearing Restrictions: No LUE Weight Bearing: Weight  bearing as tolerated General:   Vital Signs: Therapy Vitals Temp: 98.6 F (37 C) Temp Source: Oral Pulse Rate: 93 Resp: 20 BP: (!) 145/66 Patient Position (if appropriate): Lying Oxygen Therapy SpO2: 99 % O2 Device: Room Air Pain: Pain Assessment Pain Scale: 0-10 Pain Score: 0-No pain Agitated Behavior Scale: TBI  Observation Details Observation Environment: pt room Start of observation period - Date: 06/18/21 Start of observation period - Time: 1030 End of observation period - Date: 06/18/21 End of observation period - Time: 1110 Agitated Behavior Scale (DO NOT LEAVE BLANKS) Short attention span, easy distractibility, inability to concentrate: Present to a slight degree Impulsive, impatient, low tolerance for pain or frustration: Present to a slight degree Uncooperative, resistant to care, demanding: Present to a slight degree Violent and/or threatening violence toward people or property: Absent Explosive and/or unpredictable anger: Absent Rocking, rubbing, moaning, or other self-stimulating behavior: Absent Pulling at tubes, restraints, etc.: Absent Wandering from treatment areas: Absent Restlessness, pacing, excessive movement: Absent Repetitive behaviors, motor, and/or verbal: Absent Rapid, loud, or excessive talking: Absent Sudden changes of mood: Absent Easily initiated or excessive crying and/or laughter: Absent Self-abusiveness, physical and/or verbal: Absent Agitated behavior scale total score: 17  ADL: ADL Eating: NPO Grooming: Contact guard Where Assessed-Grooming: Edge of bed Upper Body Bathing: Minimal assistance Where Assessed-Upper Body Bathing: Edge of bed Lower Body Bathing: Maximal assistance Where Assessed-Lower Body Bathing: Bed level Upper Body Dressing: Minimal assistance Where Assessed-Upper Body Dressing: Edge of bed  Lower Body Dressing: Dependent Where Assessed-Lower Body Dressing: Bed level Toileting: Dependent Where  Assessed-Toileting: Bed level Toilet Transfer: Unable to assess Tub/Shower Transfer: Unable to assess Vision   Perception    Praxis   Exercises:   Other Treatments:     Therapy/Group: Individual Therapy  Tonny Branch 06/18/2021, 11:12 AM
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
## 2021-06-18 NOTE — Progress Notes (Signed)
Occupational Therapy Session Note  Patient Details  Name: John Bryan MRN: [id number] Date of Birth: 03-01-1951  Today's Date: 06/18/2021 OT Individual Time: 1025-8527 OT Individual Time Calculation (min): 30 min    Short Term Goals: Week 2:  OT Short Term Goal 1 (Week 2): Pt will complete transfer to [facility] with mod A OT Short Term Goal 2 (Week 2): Pt will demonstrate improved activity tolerance by participating in >120 min of therapy/day OT Short Term Goal 3 (Week 2): Pt will complete UB ADLs with set up assist only EOB  Skilled Therapeutic Interventions/Progress Updates:    Pt received semi-reclined in bed with NT present, cont to c/o back pain but reports it is better than before, agreeable to therapy. Session focus on self-care retraining, activity tolerance, functional mobility in prep for improved ADL/IADL/func mobility performance + decreased caregiver burden. Rolled R and L with min A to don brief at bed level. Came sitting EOB with mod A to progress BLE off bed and to lift trunk with use of bed rail. Doffed gown and donned new shirt close S for balance sitting EOB. Noted mild posterior bias in sitting. Multiple attempts with and w/o RW for STS to don shorts. Able to come up with therapist in front, but unable to achieve fully upright posture and req to sit down "why are you asking for me to do something I can't do". Total A to don shorts bed level. Returned to supine mod A to progress BLE onto bed, reports "room is spinning" but resolves with rest. Total A to don B socks and Teds bed level. Able to assist boost up in bed by pulling up on bed rail. Pt self-drank from cup with straw - removed cup from pt reach and notified RN to remove straw per sinus precautions. Pt left with HOB at 30 degrees with bed alarm engaged, 4 bed rails up, call bell in reach, and all immediate needs met.    Therapy Documentation Precautions:  Precautions Precautions: Fall Precaution Comments: G-tube,  fall, L 5th digit WBAT, sinus precautions Restrictions Weight Bearing Restrictions: No LUE Weight Bearing: Weight bearing as tolerated  Pain:  See session note ADL: See Care Tool for more details.  Agitated Behavior Scale: TBI Observation Details Observation Environment: pt room Start of observation period - Date: 06/18/21 Start of observation period - Time: 0902 End of observation period - Date: 06/18/21 End of observation period - Time: 0932 Agitated Behavior Scale (DO NOT LEAVE BLANKS) Short attention span, easy distractibility, inability to concentrate: Present to a slight degree Impulsive, impatient, low tolerance for pain or frustration: Present to a slight degree Uncooperative, resistant to care, demanding: Present to a slight degree Violent and/or threatening violence toward people or property: Absent Explosive and/or unpredictable anger: Absent Rocking, rubbing, moaning, or other self-stimulating behavior: Absent Pulling at tubes, restraints, etc.: Absent Wandering from treatment areas: Absent Restlessness, pacing, excessive movement: Absent Repetitive behaviors, motor, and/or verbal: Absent Rapid, loud, or excessive talking: Absent Sudden changes of mood: Absent Easily initiated or excessive crying and/or laughter: Absent Self-abusiveness, physical and/or verbal: Absent Agitated behavior scale total score: 17   Therapy/Group: Individual Therapy  Volanda Napoleon MS, OTR/L  06/18/2021, 6:49 AM

## 2021-06-18 NOTE — Progress Notes (Addendum)
Speech Language Pathology Daily Session Note  Patient Details  Name: John Bryan MRN: [id number] Date of Birth: [birth year]  Today's Date: 06/18/2021 SLP Individual Time: 7062-3762 SLP Individual Time Calculation (min): 20 min  Short Term Goals: Week 3: SLP Short Term Goal 1 (Week 3): Patient will demonstrate efficient mastication and complete oral clearance with trials of Dys. 3 textures with minimal overt s/s of aspiration over 2 sessions prior to upgrade. SLP Short Term Goal 2 (Week 3): Patient will demonstrate sustained attention to functional tasks fo 10 minutes with Mod verbal cues for redirection. SLP Short Term Goal 3 (Week 3): Patient will orient to time, place, situation using external aides with min verbal and visual cues. SLP Short Term Goal 4 (Week 3): Patient will demonstrate awareness to errors when completing basic level problem solving tasks with min verbal cues.  Skilled Therapeutic Interventions: Skilled SLP intervention focused on cognition and dysphagia. Pt oriented to date and day of the week with mod A visual and verbal cues with calendar. He sustained attention with structured card sorting task for 6 minutes with supervision cues for redirection. Pt consumed dys 2 snack with thin liquids at end of session and tolerated with no overt s/sx of aspiration or penetration. Mastication and oral clearance were adequate for dys 2 consistency. Cont with therapy per plan of care.      Pain Pain Assessment Pain Scale: Faces Pain Score: 0-No pain Faces Pain Scale: No hurt  Therapy/Group: Individual Therapy  John Bryan 06/18/2021, 12:28 PM

## 2021-06-19 DIAGNOSIS — S069X3S Unspecified intracranial injury with loss of consciousness of 1 hour to 5 hours 59 minutes, sequela: Secondary | ICD-10-CM | POA: Diagnosis not present

## 2021-06-19 DIAGNOSIS — A0472 Enterocolitis due to Clostridium difficile, not specified as recurrent: Secondary | ICD-10-CM | POA: Diagnosis not present

## 2021-06-19 LAB — GLUCOSE, CAPILLARY
Glucose-Capillary: 131 mg/dL — ABNORMAL HIGH (ref 70–99)
Glucose-Capillary: 187 mg/dL — ABNORMAL HIGH (ref 70–99)
Glucose-Capillary: 155 mg/dL — ABNORMAL HIGH (ref 70–99)
Glucose-Capillary: 96 mg/dL (ref 70–99)

## 2021-06-19 NOTE — Progress Notes (Signed)
Speech Language Pathology TBI Note  Patient Details  Name: John Bryan MRN: [id number] Date of Birth: 1951/01/12  Today's Date: 06/19/2021 SLP Individual Time: 1115-1200 SLP Individual Time Calculation (min): 45 min  Short Term Goals: Week 3: SLP Short Term Goal 1 (Week 3): Patient will demonstrate efficient mastication and complete oral clearance with trials of Dys. 3 textures with minimal overt s/s of aspiration over 2 sessions prior to upgrade. SLP Short Term Goal 2 (Week 3): Patient will demonstrate sustained attention to functional tasks fo 10 minutes with Mod verbal cues for redirection. SLP Short Term Goal 3 (Week 3): Patient will orient to time, place, situation using external aides with min verbal and visual cues. SLP Short Term Goal 4 (Week 3): Patient will demonstrate awareness to errors when completing basic level problem solving tasks with min verbal cues.  Skilled Therapeutic Interventions:   Pt was seen for skilled ST targeting goals for dysphagia.  SLP facilitated the session with a functional snack of dys 2 textures and thin liquids to monitor toleration of current diet.  Pt consumed snack with supervision cues for use of swallowing precautions, no overt s/s of aspiration, and no difficulty clearing solids from the oral cavity.  Pt spoke briefly and calmly about his frustration with PT and breakfast this morning although he was adamant that he did not want to work with PT again.  Upon review of PT and MD's notes after treatment session, it seems that his delayed breakfast tray was outside of therapist's control and RN's attempts to expedite a breakfast tray lead to additional misplaced frustration towards PT as his expedited tray did not contain certain items that pt had ordered.  Pt was easily redirected despite this situation precipitating agitation earlier this morning.  Pt also calmly expressed his frustration at wait times for being cleaned after being incontinent.  When  questioned, pt stated that he often only calls once and won't call again for another hour because he doesn't want to bother nursing staff.  SLP provided skilled education regarding the importance of timely hygiene after an incontinent episode to prevent skin breakdown and that he should contact the front desk again if staff hasn't arrived within 10 minutes of initial call.  Pt verbalized understanding but will likely need ongoing education and training regarding effective use of call bell.  Pt was left in bed with bed alarm set and call bell within reach.  Continue per current plan of care.    Pain  No/denies pain  Agitated Behavior Scale: TBI Observation Details Observation Environment: pt room Start of observation period - Date: 06/19/21 Start of observation period - Time: 1115 End of observation period - Date: 06/19/21 End of observation period - Time: 1200 Agitated Behavior Scale (DO NOT LEAVE BLANKS) Short attention span, easy distractibility, inability to concentrate: Present to a slight degree Impulsive, impatient, low tolerance for pain or frustration: Absent Uncooperative, resistant to care, demanding: Absent Violent and/or threatening violence toward people or property: Absent Explosive and/or unpredictable anger: Absent Rocking, rubbing, moaning, or other self-stimulating behavior: Absent Pulling at tubes, restraints, etc.: Absent Wandering from treatment areas: Absent Restlessness, pacing, excessive movement: Absent Repetitive behaviors, motor, and/or verbal: Absent Rapid, loud, or excessive talking: Absent Sudden changes of mood: Absent Easily initiated or excessive crying and/or laughter: Absent Self-abusiveness, physical and/or verbal: Absent Agitated behavior scale total score: 15  Therapy/Group: Individual Therapy  Brekyn Huntoon, Melanee Spry 06/19/2021, 3:54 PM

## 2021-06-19 NOTE — Progress Notes (Signed)
Physical Therapy Note  Patient Details  Name: John Bryan MRN: [id number] Date of Birth: 1951-07-20 Today's Date: 06/19/2021    Patient very short with therapist and agitated that he had not had breakfast prior to therapy. Shouted "you especially always come here at the wrong time!" "I will not work with you!" PT stepped out to reduce risk of further escalating the patient's agitation. Patient missed 45 min of skilled PT due to refusal/agitation, RN made aware. Will attempt to make-up missed time as able. NT made aware that patient was ready to eat breakfast, no food tray was ordered, charge nurse ordered food tray for the patient.     Latora Quarry L Gurnoor Sloop PT, DPT  06/19/2021, 8:05 AM
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
## 2021-06-19 NOTE — Progress Notes (Signed)
Occupational Therapy Session Note  Patient Details  Name: John Bryan MRN: [id number] Date of Birth: 11-Sep-1951  Today's Date: 06/19/2021 OT Individual Time: 1303-1311 OT Individual Time Calculation (min): 8 min  52 minutes missed   Skilled Therapeutic Interventions/Progress Updates:    Pt greeted in bed, asleep, easily wakened however responding to all questions with "what"? Tried to establish rapport with pt, encouraged participation in any self care or therapeutic activity. Pt at times turning away from therapist and closing eyes. NT arrived to assess vitals and pt told him: "I've seen her already today and I've had enough of her." When pt was educated that this was his first time meeting OT, his response did not change. Pt stated he wanted therapist to "go away." Tx time missed due to refusal/agitation.   Therapy Documentation Precautions:  Precautions Precautions: Fall Precaution Comments: G-tube, fall, L 5th digit WBAT, sinus precautions Restrictions Weight Bearing Restrictions: No LUE Weight Bearing: Weight bearing as tolerated  Vital Signs: Therapy Vitals Temp: 98.4 F (36.9 C) Pulse Rate: 67 Resp: 18 BP: 133/60 Patient Position (if appropriate): Lying Oxygen Therapy SpO2: 100 % O2 Device: Room Air Pain: no s/s pain during tx   ADL: ADL Eating: NPO Grooming: Contact guard Where Assessed-Grooming: Edge of bed Upper Body Bathing: Minimal assistance Where Assessed-Upper Body Bathing: Edge of bed Lower Body Bathing: Maximal assistance Where Assessed-Lower Body Bathing: Bed level Upper Body Dressing: Minimal assistance Where Assessed-Upper Body Dressing: Edge of bed Lower Body Dressing: Dependent Where Assessed-Lower Body Dressing: Bed level Toileting: Dependent Where Assessed-Toileting: Bed level Toilet Transfer: Unable to assess Tub/Shower Transfer: Unable to assess     Therapy/Group: Individual Therapy  Roizy Harold A Misty Rago 06/19/2021, 3:50 PM

## 2021-06-20 DIAGNOSIS — S069X3S Unspecified intracranial injury with loss of consciousness of 1 hour to 5 hours 59 minutes, sequela: Secondary | ICD-10-CM | POA: Diagnosis not present

## 2021-06-20 DIAGNOSIS — R1312 Dysphagia, oropharyngeal phase: Secondary | ICD-10-CM | POA: Diagnosis not present

## 2021-06-20 DIAGNOSIS — E119 Type 2 diabetes mellitus without complications: Secondary | ICD-10-CM | POA: Diagnosis not present

## 2021-06-20 DIAGNOSIS — A0472 Enterocolitis due to Clostridium difficile, not specified as recurrent: Secondary | ICD-10-CM | POA: Diagnosis not present

## 2021-06-20 LAB — GLUCOSE, CAPILLARY
Glucose-Capillary: 81 mg/dL (ref 70–99)
Glucose-Capillary: 154 mg/dL — ABNORMAL HIGH (ref 70–99)
Glucose-Capillary: 261 mg/dL — ABNORMAL HIGH (ref 70–99)
Glucose-Capillary: 135 mg/dL — ABNORMAL HIGH (ref 70–99)

## 2021-06-20 NOTE — Progress Notes (Signed)
Occupational Therapy Note  Patient Details  Name: John Bryan MRN: [id number] Date of Birth: Jan 18, 1951  Today's Date: 06/20/2021 OT Missed Time: 60 Minutes Missed Time Reason: Patient unwilling/refused to participate without medical reason wil f/u as able to make up missed minutes.      Pollyann Glen [facility] 06/20/2021, 4:05 PM

## 2021-06-20 NOTE — Progress Notes (Signed)
Occupational Therapy Weekly Progress Note  Patient Details  Name: John Bryan MRN: [id number] Date of Birth: 07-Mar-1951  Beginning of progress report period: June 13, 2021 End of progress report period: June 20, 2021  Today's Date: 06/20/2021 OT Individual Time: 2355-7322 OT Individual Time Calculation (min): 53 min    Patient has met 0 of 3 short term goals. John Bryan continues to be limited by frequent incontinent BM's that restrict his participation in therapy sessions, as well as continued agitation, reduced frustration tolerance, fatigue, and overall poor self-regulation/organization skills. Cognitively he has cleared a bit more, yet remains easily agitated and frustrated with real/perceived adjustments to his daily schedule. Patching has been successful in managing his bilateral visual motor deficits, yet he is frequently dizzy which also limits his participation. When pt has participated in therapy, he has made physical gains in transfers and ADLs overall.   Patient continues to demonstrate the following deficits: muscle weakness, decreased visual acuity and decreased visual motor skills, decreased initiation, decreased attention, decreased awareness, decreased problem solving, decreased safety awareness, decreased memory, and delayed processing, and decreased sitting balance, decreased standing balance, and decreased balance strategies and therefore will continue to benefit from skilled OT intervention to enhance overall performance with BADL and Reduce care partner burden.  Patient not progressing toward long term goals.  See goal revision..  Continue plan of care for now. Will reach out to family to discuss plan of action moving forward if pt continues to not progress/particiapte   OT Short Term Goals Week 2:  OT Short Term Goal 1 (Week 2): Pt will complete transfer to [facility] with mod A OT Short Term Goal 1 - Progress (Week 2): Not met OT Short Term Goal 2 (Week 2): Pt will demonstrate  improved activity tolerance by participating in >120 min of therapy/day OT Short Term Goal 2 - Progress (Week 2): Not met OT Short Term Goal 3 (Week 2): Pt will complete UB ADLs with set up assist only EOB OT Short Term Goal 3 - Progress (Week 2): Not met Week 3:  OT Short Term Goal 1 (Week 3): Pt will demo improved activity tolerance by participating in 60 min of therapy/day OT Short Term Goal 2 (Week 3): Pt will complete stand pivot transfer to the toilet with mod A OT Short Term Goal 3 (Week 3): Pt will demo improved self regulation skills by not becoming agitated with therapy staff 50% of sessions  Skilled Therapeutic Interventions/Progress Updates:    Pt supine with RN present. Pt very angry/frustrated re RN stating he requires full supervision to consume thin liquids. Confirmed with SLP that pt is cleared to consume liquids bedside without supervision and updated his safe swallowing sheet above HOB. Attempted cueing and therapeutic use of self to encourage improved self regulation to calm pt down. Pt cursing and yelling at OT but eventually was able to calm down. Discussed need to increase participation in therapy sessions, pt reporting frequent Bms as a barrier and OT agreed but also reminded pt that he frequently refuses without reason. Pt completed bed mobility to EOB with min A for LE management. No further agitation during session. Pt requried max A to don shorts EOB. He completed sit > stand with mod A using RW. Pivot transfer to w/c with min A. Pt reporting sacral pain despite ROHO cushion. He completed 40 + 10 ft of functional mobility at min A level with close w/c follow. Pt required cueing for posture, RW management and increasing BOS. Pt  able to self monitor need for rest breaks. He returned to his room and requested to return to bed. He was left supine with all needs met bed alarm set.   Therapy Documentation Precautions:  Precautions Precautions: Fall Precaution Comments: G-tube,  fall, L 5th digit WBAT, sinus precautions Restrictions Weight Bearing Restrictions: No LUE Weight Bearing: Weight bearing as tolerated   Therapy/Group: Individual Therapy  John Bryan 06/20/2021, 6:02 AM
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
## 2021-06-20 NOTE — Progress Notes (Signed)
Speech Language Pathology TBI Note  Patient Details  Name: John Bryan MRN: [id number] Date of Birth: 08/22/1951  Today's Date: 06/20/2021 SLP Individual Time: 0175-1025 SLP Individual Time Calculation (min): 40 min  Short Term Goals: Week 3: SLP Short Term Goal 1 (Week 3): Patient will demonstrate efficient mastication and complete oral clearance with trials of Dys. 3 textures with minimal overt s/s of aspiration over 2 sessions prior to upgrade. SLP Short Term Goal 2 (Week 3): Patient will demonstrate sustained attention to functional tasks fo 10 minutes with Mod verbal cues for redirection. SLP Short Term Goal 3 (Week 3): Patient will orient to time, place, situation using external aides with min verbal and visual cues. SLP Short Term Goal 4 (Week 3): Patient will demonstrate awareness to errors when completing basic level problem solving tasks with min verbal cues.  Skilled Therapeutic Interventions: Skilled treatment session focused on dysphagia and cognitive goals. SLP facilitated session by attempting to donn dentures again with minimal success, patient reports due to changes in "the teeth that anchor the dentures." SLP provided Mod A verbal cues for emergent awareness regarding why he is unable to leave to go to the dentist while in the hospital to get this addressed. Patient consumed trials of Dys. 3 textures with slow and deliberate mastication with liquid washes needed to clear oral stasis. Patient also reported mild pain with mastication. Recommend ongoing trials with SLP. Patient was continent of bowel and followed directions for bed mobility with Mod I. Patient reported pain in tailbone. Patient provided importance of getting out of bed to help with relief. He verbalize understanding. Patient left upright in bed with alarm on and all needs within reach. Continue with current plan of care.      Pain Discomfort in tailbone, patient repositioned   Agitated Behavior  Scale: TBI Observation Details Observation Environment: Patient's room Start of observation period - Date: 06/20/21 Start of observation period - Time: 0805 End of observation period - Date: 06/20/21 End of observation period - Time: 0845 Agitated Behavior Scale (DO NOT LEAVE BLANKS) Short attention span, easy distractibility, inability to concentrate: Present to a slight degree Impulsive, impatient, low tolerance for pain or frustration: Absent Uncooperative, resistant to care, demanding: Absent Violent and/or threatening violence toward people or property: Absent Explosive and/or unpredictable anger: Absent Rocking, rubbing, moaning, or other self-stimulating behavior: Absent Pulling at tubes, restraints, etc.: Absent Wandering from treatment areas: Absent Restlessness, pacing, excessive movement: Absent Repetitive behaviors, motor, and/or verbal: Absent Rapid, loud, or excessive talking: Absent Sudden changes of mood: Absent Easily initiated or excessive crying and/or laughter: Absent Self-abusiveness, physical and/or verbal: Absent Agitated behavior scale total score: 15  Therapy/Group: Individual Therapy  John Bryan 06/20/2021, 8:56 AM

## 2021-06-20 NOTE — Progress Notes (Signed)
Physical Therapy Weekly Progress Note  Patient Details  Name: John Bryan MRN: [id number] Date of Birth: [birth year]  Beginning of progress report period: June 12, 2021 End of progress report period: June 20, 2021  Patient has met 2 of 3 short term goals.  Patient continues to have slow but steady progress, limited by frequency of bowl incontinence, agitation with staff, and displays of learned helplessness. Patient is able to perform bed mobility with min A, transfers with mod A using a RW with intermittent elevated bed height due to back pain, and initiated gait training 5-12 feet with RW and CGA, only able to participate in 2 trials of gait training, limited by ailments listed above.   Patient continues to demonstrate the following deficits muscle weakness and muscle joint tightness, decreased cardiorespiratoy endurance, abnormal tone, decreased attention, decreased awareness, decreased problem solving, and decreased memory, central origin, and decreased sitting balance, decreased standing balance, decreased postural control, and decreased balance strategies and therefore will continue to benefit from skilled PT intervention to increase functional independence with mobility.  Patient progressing toward long term goals..  Continue plan of care. Will assess patient's progress next week, as bowl incontinence is beginning to improve.   PT Short Term Goals Week 2:  PT Short Term Goal 1 (Week 2): Pt will transfer with mod assist  and LRAD to WC PT Short Term Goal 1 - Progress (Week 2): Met PT Short Term Goal 2 (Week 2): Pt will tolerate sitting in WC 1 hour between therapies PT Short Term Goal 2 - Progress (Week 2): Progressing toward goal PT Short Term Goal 3 (Week 2): Pt will ambulate 45f with mod assist and LRAD PT Short Term Goal 3 - Progress (Week 2): Met Week 3:  PT Short Term Goal 1 (Week 3): Patient will perform bed mobility with CGA consistently with use of hospital bed features. PT  Short Term Goal 2 (Week 3): Patient will perform basic transfers with min A >50% of the time using LRAD. PT Short Term Goal 3 (Week 3): Patient will ambulate 50 ft with CGA using RW. PT Short Term Goal 4 (Week 3): Patient will initiate stair training.    Tycho Cheramie L Jeydan Barner PT, DPT  06/20/2021, 6:38 AM

## 2021-06-21 DIAGNOSIS — R1312 Dysphagia, oropharyngeal phase: Secondary | ICD-10-CM | POA: Diagnosis not present

## 2021-06-21 DIAGNOSIS — S069X3S Unspecified intracranial injury with loss of consciousness of 1 hour to 5 hours 59 minutes, sequela: Secondary | ICD-10-CM | POA: Diagnosis not present

## 2021-06-21 DIAGNOSIS — E119 Type 2 diabetes mellitus without complications: Secondary | ICD-10-CM | POA: Diagnosis not present

## 2021-06-21 DIAGNOSIS — A0472 Enterocolitis due to Clostridium difficile, not specified as recurrent: Secondary | ICD-10-CM | POA: Diagnosis not present

## 2021-06-21 LAB — GLUCOSE, CAPILLARY
Glucose-Capillary: 104 mg/dL — ABNORMAL HIGH (ref 70–99)
Glucose-Capillary: 107 mg/dL — ABNORMAL HIGH (ref 70–99)
Glucose-Capillary: 113 mg/dL — ABNORMAL HIGH (ref 70–99)
Glucose-Capillary: 128 mg/dL — ABNORMAL HIGH (ref 70–99)
Glucose-Capillary: 181 mg/dL — ABNORMAL HIGH (ref 70–99)
Glucose-Capillary: 183 mg/dL — ABNORMAL HIGH (ref 70–99)

## 2021-06-21 NOTE — Progress Notes (Signed)
Occupational Therapy TBI Note  Patient Details  Name: John Bryan MRN: [id number] Date of Birth: October 11, 1951  Today's Date: 06/21/2021 OT Individual Time: 6803-2122 OT Individual Time Calculation (min): 27 min   Session 2: OT Individual Time: 1345-1415 OT Individual Time Calculation (min): 30 min    Short Term Goals: Week 3:  OT Short Term Goal 1 (Week 3): Pt will demo improved activity tolerance by participating in 60 min of therapy/day OT Short Term Goal 2 (Week 3): Pt will complete stand pivot transfer to the toilet with mod A OT Short Term Goal 3 (Week 3): Pt will demo improved self regulation skills by not becoming agitated with therapy staff 50% of sessions   Skilled Therapeutic Interventions/Progress Updates:  Session 1:    Pt supine with no c/o pain and agreeable to OT! Pt in [facility] better spirits and more participatory. Pt did report urinary incontinence and max A for brief change at bed level. Pt able to roll R and L with supervision. Pt completed bed mobility with min A. Pt completed sit > stand with mod A from EOB. 48 ft of functional mobility with the RW with CGA- min A- cueing for RW management and positioning. Pt rapidly fatigues. Pt returned to his room, another 45 ft of functional mobility with min A and cueing for RW management and pacing. Pt returned to supine and was left with all needs met, bed alarm set.    Session 2:  Pt recevied supine with no c/o pain but fatigue. Again, pt much more agreeable and participatory in session! Pt completed bed mobility to EOB with min A. He stood with mod A and had urgent urinary needs. Attempted to position urinal for pt to void in standing but OT unable to manage balance needs and pants. Pt returned to sitting and voided 100 cc of urine. Pt completed ambulatory transfer into the bathroom, ~10 ft before stating he was too fatigue and wanted to return to EOB. He was able to complete 3 steps backwards with min A and then return to EOB.  He transitioned into supine, reporting pain in his back during transition but it alleviated with rest. He was left supine with all needs met, bed alarm set.   Therapy Documentation Precautions:  Precautions Precautions: Fall Precaution Comments: G-tube, fall, L 5th digit WBAT, sinus precautions Restrictions Weight Bearing Restrictions: No LUE Weight Bearing: Weight bearing as tolerated  Pain Assessment Pain Scale: 0-10 Pain Score: 0-No pain Agitated Behavior Scale: TBI Observation Details Observation Environment: Pt room Start of observation period - Date: 06/21/21 Start of observation period - Time: 0930 End of observation period - Date: 06/21/21 End of observation period - Time: 1000 Agitated Behavior Scale (DO NOT LEAVE BLANKS) Short attention span, easy distractibility, inability to concentrate: Present to a slight degree Impulsive, impatient, low tolerance for pain or frustration: Absent Uncooperative, resistant to care, demanding: Absent Violent and/or threatening violence toward people or property: Absent Explosive and/or unpredictable anger: Absent Rocking, rubbing, moaning, or other self-stimulating behavior: Absent Pulling at tubes, restraints, etc.: Absent Wandering from treatment areas: Absent Restlessness, pacing, excessive movement: Absent Repetitive behaviors, motor, and/or verbal: Absent Rapid, loud, or excessive talking: Absent Sudden changes of mood: Absent Easily initiated or excessive crying and/or laughter: Absent Self-abusiveness, physical and/or verbal: Absent Agitated behavior scale total score: 15   Therapy/Group: Individual Therapy  Curtis Sites 06/21/2021, 11:15 AM

## 2021-06-21 NOTE — Progress Notes (Addendum)
Physical Therapy Session Note  Patient Details  Name: KAMRIN SIBLEY MRN: [id number] Date of Birth: 05-14-1951  Today's Date: 06/21/2021 PT Individual Time: 1440-1520 PT Individual Time Calculation (min): 40 min   Short Term Goals: Week 1:  PT Short Term Goal 1 (Week 1): Pt will transfer with mod assist  and LRAD to WC PT Short Term Goal 1 - Progress (Week 1): Progressing toward goal PT Short Term Goal 2 (Week 1): Pt will tolerate sitting in WC>2 hours between therapies PT Short Term Goal 2 - Progress (Week 1): Progressing toward goal PT Short Term Goal 3 (Week 1): Pt will ambulate 32f with mod assist and LRAD PT Short Term Goal 3 - Progress (Week 1): Progressing toward goal Week 2:  PT Short Term Goal 1 (Week 2): Pt will transfer with mod assist  and LRAD to WC PT Short Term Goal 1 - Progress (Week 2): Met PT Short Term Goal 2 (Week 2): Pt will tolerate sitting in WC 1 hour between therapies PT Short Term Goal 2 - Progress (Week 2): Progressing toward goal PT Short Term Goal 3 (Week 2): Pt will ambulate 238fwith mod assist and LRAD PT Short Term Goal 3 - Progress (Week 2): Met Week 3:  PT Short Term Goal 1 (Week 3): Patient will perform bed mobility with CGA consistently with use of hospital bed features. PT Short Term Goal 2 (Week 3): Patient will perform basic transfers with min A >50% of the time using LRAD. PT Short Term Goal 3 (Week 3): Patient will ambulate 50 ft with CGA using RW. PT Short Term Goal 4 (Week 3): Patient will initiate stair training. Week 4:     Skilled Therapeutic Interventions/Progress Updates:   PAIN pt c/o back pain w/transitions and functional mobility, rates 5/10, treatment to tolerance, additional time for recovery between efforts, pt dictates pace of session.  Pt initially sleeping and wakes w/start to gentle touch to foot.  Agreeable to session, states "I'll try".  Teds and gripper socks donned by therapist.  Supine to side w/supervision, side to  sit w/min assist and additional time due to back pain.  Pt rests in sitting w/supervision only.  Attempted Sit to stand from low height bed but pt unable, bed raised to avoid frustration then Sit to stand w/cga.  Pt stands 1 min w/RW and cga, adjusts to change of position.   Gait 2043fncluding turning outside of doorway to wc placed in hall, backs and sits w/moderate cues for safety, centering to wc.  Very slow cadence.  Very slow transitioning between position changes. Pt states "I don't know if I can do anymore of this" but after resting 5-6 min agrees to second gait trial.  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 Sit to stand w/RW from wc w/min assist.  Gait [id number], min assist for walker management, cues for posture and to maintain safe distance within walker.  Turn/sit to edge of bed w/cga, cues.   In sitting pt drinks water, recovers several min. Sit to supine w/mod assist for Les.   Pt left supine w/rails up x 4 alarm set, bed in lowest position, and needs in reach.   Therapy Documentation Precautions:  Precautions Precautions: Fall Precaution Comments: G-tube, fall, L 5th digit WBAT, sinus precautions Restrictions Weight Bearing Restrictions: No LUE Weight Bearing: Weight bearing as tolerated  Therapy/Group: Individual Therapy Callie Fielding, [place] 06/21/2021, 3:36 PM

## 2021-06-21 NOTE — Progress Notes (Signed)
Speech Language Pathology TBI Note  Patient Details  Name: John Bryan MRN: [id number] Date of Birth: 23-Jun-1951  Today's Date: 06/21/2021 SLP Individual Time: 1100-1130 SLP Individual Time Calculation (min): 30 min  Short Term Goals: Week 3: SLP Short Term Goal 1 (Week 3): Patient will demonstrate efficient mastication and complete oral clearance with trials of Dys. 3 textures with minimal overt s/s of aspiration over 2 sessions prior to upgrade. SLP Short Term Goal 2 (Week 3): Patient will demonstrate sustained attention to functional tasks fo 10 minutes with Mod verbal cues for redirection. SLP Short Term Goal 3 (Week 3): Patient will orient to time, place, situation using external aides with min verbal and visual cues. SLP Short Term Goal 4 (Week 3): Patient will demonstrate awareness to errors when completing basic level problem solving tasks with min verbal cues.  Skilled Therapeutic Interventions: Skilled treatment session focused on cognitive goals. Upon arrival, patient was asleep in bed but easily roused. Patient required multiple repetitions due to being hard of hearing. SLP asked if patient can donn hearing aids and he replied that "they needed some work." With extra time and supervision level verbal cues for problem solving, patient able to donn new rubber dome onto his hearing aid and change out the batteries. Despite this, patient reports that he still unable to hear with them in place and feels that he hears better without them. Patient demonstrated sustained attention to task for ~25 minutes with Mod I and remained calm without signs of frustration. Patient was also independently oriented to place and situation and required supervision verbal cues for orientation to time. Patient left upright in bed with alarm on and all needs within reach.      Pain Pain Assessment Pain Scale: 0-10 Pain Score: 0-No pain  Agitated Behavior Scale: TBI Observation Details Observation  Environment: Patient's room Start of observation period - Date: 06/21/21 Start of observation period - Time: 1100 End of observation period - Date: 06/21/21 End of observation period - Time: 1130 Agitated Behavior Scale (DO NOT LEAVE BLANKS) Short attention span, easy distractibility, inability to concentrate: Absent Impulsive, impatient, low tolerance for pain or frustration: Absent Uncooperative, resistant to care, demanding: Absent Violent and/or threatening violence toward people or property: Absent Explosive and/or unpredictable anger: Absent Rocking, rubbing, moaning, or other self-stimulating behavior: Absent Pulling at tubes, restraints, etc.: Absent Wandering from treatment areas: Absent Restlessness, pacing, excessive movement: Absent Repetitive behaviors, motor, and/or verbal: Absent Rapid, loud, or excessive talking: Absent Sudden changes of mood: Absent Easily initiated or excessive crying and/or laughter: Absent Self-abusiveness, physical and/or verbal: Absent Agitated behavior scale total score: 14  Therapy/Group: Individual Therapy  Ilda Laskin 06/21/2021, 12:40 PM

## 2021-06-21 NOTE — Progress Notes (Addendum)
Patient ID: John Bryan, male   DOB: [birth year], 70 y.o.   MRN: [id number]  SW spoke with pt dtr [name] to provide updates from team conference, and d/c date 8/2. SW discussed if they have arrived. Dtr confirms they are now here in [place], but have to go back to [place] for more of their things, and will be driving back. SW informed on DME recs: RW, w/c with roho cushion and HH therapies. SW informed will confirm the severity of skin breakdown on bottom as insurance may not cover cushion. SW also explained insurance will only cover RW or w/c, and a w/c is a rental item until paid within 13 months. SW to email HHA list for her to review and f/u with SW on preference. Family edu on Fri (7/22) 1pm-3pm.   *SW emailed HHA list to [email or web address]  *SW ordered DME: RW, w/c, and roho cushion with [employer] via parachute.   Cecile Sheerer, MSW, LCSWA Office: [phone] Cell: [phone] Fax: [phone]
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
## 2021-06-21 NOTE — Patient Care Conference (Signed)
Inpatient RehabilitationTeam Conference and Plan of Care Update Date: 06/21/2021   Time: 10:07 AM    Patient Name: John Bryan      Medical Record Number: [id number]  Date of Birth: 18-Jan-1951 Sex: Male         Room/Bed: 4W09C/4W09C-01 Payor Info: Payor: MEDICARE / Plan: MEDICARE PART A AND B / Product Type: *No Product type* /    Admit Date/Time:  06/03/2021  2:34 PM  Primary Diagnosis:  TBI (traumatic brain injury) [facility])  Hospital Problems: Principal Problem:   TBI (traumatic brain injury) [facility]) Active Problems:   C. difficile diarrhea    Expected Discharge Date: Expected Discharge Date: 07/05/21  Team Members Present: Physician leading conference: Dr. Faith Rogue Care Coodinator Present: Cecile Sheerer, LCSWA;Laneisha Mino Marlyne Beards, RN, BSN, CRRN Nurse Present: Kennyth Arnold, RN PT Present: Malachi Pro, PT OT Present: Jake Shark, OT SLP Present: Feliberto Gottron, SLP PPS Coordinator present : Fae Pippin, SLP     Current Status/Progress Goal Weekly Team Focus  Bowel/Bladder   Pt is incontinent of bowel and bladder  Pt will gain continence of bowel and bladder  Will assess qshift and PRN   Swallow/Nutrition/ Hydration   Dys. 2 textures with thin lqiuids, Full supervision  Mod I  Tolerance of current diet, use of swallow strategies   ADL's   Still with poor participation- did 50 ft ambulation with min A, mod A to stand. Max A LB ADLs  CGA- supervision  cognitive retraining, ADLs retraining, generalized activity tolerance   Mobility   Mod-min A overall, gait up to 50 ft with RW x1, continues to be limited by loose bowls, but improving  Min A overall  Activity tolerance, visual retraining PRN, functional mobility, gait and stair training, agitation with staff (improving), patient/caregiver education   Communication             Safety/Cognition/ Behavioral Observations  Mod A  supervision  attention, orientation, awareness   Pain   Pt is pain free  Pt will remain  pain free  Will assess qshift and PRN   Skin   Pt's bottom is red  Butt cream applied to pt's bottom  Will assess qshift and PRN     Discharge Planning:  D/c to home with s/o. Pt dtr and her husband moving here to help assist as reported s/o not able to handle pt. Their expected arrival is 7/19 or 7/20.   Team Discussion: Being treated now for C-diff, will be long-term treatment. Complains of back pain, off tube feeds. Continent B/B, c/o of headache, scheduled Tramadol effective. Has redness to bottom from excessive diarrhea.  Patient on target to meet rehab goals: yes, has had good turn around the last 2 days. Walked 50 ft contact guard to min assist with RW +1, yesterday. Mood is much better. Mod/min assist, does have learned helplessness at times. Need to know if family can provide min assist? On dys 2, thin liquids. Coughed a lot yesterday.  *See Care Plan and progress notes for long and short-term goals.   Revisions to Treatment Plan:  Treating C-diff appropriately.  Teaching Needs: Family education, medication management, pain management, skin/wound care, C-diff management, transfer training, gait training, stair training, balance training, endurance training, safety awareness.  Current Barriers to Discharge: Decreased caregiver support, Medical stability, Home enviroment access/layout, Incontinence, Wound care, Lack of/limited family support, Weight, Weight bearing restrictions, Medication compliance, Behavior, and Nutritional means  Possible Resolutions to Barriers: Continue current medications, provide emotional support.  Medical Summary Current Status: improved stool consistency. c diff rx helping---extended course. low back pain, improved po intake. dizziness better. ongoing headaches  Barriers to Discharge: Medical stability   Possible Resolutions to Barriers/Weekly Focus: c diff rx, pain mgt, skin care, maximize po nutrition   Continued Need for Acute Rehabilitation  Level of Care: The patient requires daily medical management by a physician with specialized training in physical medicine and rehabilitation for the following reasons: Direction of a multidisciplinary physical rehabilitation program to maximize functional independence : Yes Medical management of patient stability for increased activity during participation in an intensive rehabilitation regime.: Yes Analysis of laboratory values and/or radiology reports with any subsequent need for medication adjustment and/or medical intervention. : Yes   I attest that I was present, lead the team conference, and concur with the assessment and plan of the team.   Tennis Must 06/21/2021, 3:38 PM

## 2021-06-22 LAB — GLUCOSE, CAPILLARY
Glucose-Capillary: 101 mg/dL — ABNORMAL HIGH (ref 70–99)
Glucose-Capillary: 114 mg/dL — ABNORMAL HIGH (ref 70–99)
Glucose-Capillary: 122 mg/dL — ABNORMAL HIGH (ref 70–99)
Glucose-Capillary: 99 mg/dL (ref 70–99)

## 2021-06-22 NOTE — Progress Notes (Signed)
Speech Language Pathology TBI Note  Patient Details  Name: John Bryan MRN: [id number] Date of Birth: 1951-06-12  Today's Date: 06/22/2021 SLP Individual Time: 1215-1300 SLP Individual Time Calculation (min): 45 min  Short Term Goals: Week 3: SLP Short Term Goal 1 (Week 3): Patient will demonstrate efficient mastication and complete oral clearance with trials of Dys. 3 textures with minimal overt s/s of aspiration over 2 sessions prior to upgrade. SLP Short Term Goal 2 (Week 3): Patient will demonstrate sustained attention to functional tasks fo 10 minutes with Mod verbal cues for redirection. SLP Short Term Goal 3 (Week 3): Patient will orient to time, place, situation using external aides with min verbal and visual cues. SLP Short Term Goal 4 (Week 3): Patient will demonstrate awareness to errors when completing basic level problem solving tasks with min verbal cues.  Skilled Therapeutic Interventions: Skilled treatment session focused on dysphagia and cognitive goals. Upon arrival, patient was asleep but easily awakened.  Patient reported that he was "starving"  due to not eating breakfast this morning. Despite Max cues, patient unable to recall 2 previous therapy sessions in which he ate his breakfast sitting EOB and walked in the hallway. Patient consumed his lunch meal of Dys. 2 textures with thin liquids without overt s/s of aspiration and was overall Mod I for use of swallowing compensatory strategies. Recommend patient continue current diet. Patient demonstrated sustained attention to self-feeding for 30 minutes with Mod I and was independently oriented to place, time and situation. Patient left upright in bed with alarm on and all needs within reach. Continue with current plan of care.      Pain Pain Assessment Pain Scale: 0-10 Pain Score: 0-No pain  Agitated Behavior Scale: TBI Observation Details Observation Environment: Patient's room Start of observation period - Date:  06/22/21 Start of observation period - Time: 1215 End of observation period - Date: 06/22/21 End of observation period - Time: 1300 Agitated Behavior Scale (DO NOT LEAVE BLANKS) Short attention span, easy distractibility, inability to concentrate: Absent Impulsive, impatient, low tolerance for pain or frustration: Absent Uncooperative, resistant to care, demanding: Absent Violent and/or threatening violence toward people or property: Absent Explosive and/or unpredictable anger: Absent Rocking, rubbing, moaning, or other self-stimulating behavior: Absent Pulling at tubes, restraints, etc.: Absent Wandering from treatment areas: Absent Restlessness, pacing, excessive movement: Absent Repetitive behaviors, motor, and/or verbal: Absent Rapid, loud, or excessive talking: Absent Sudden changes of mood: Absent Easily initiated or excessive crying and/or laughter: Absent Self-abusiveness, physical and/or verbal: Absent Agitated behavior scale total score: 14  Therapy/Group: Individual Therapy  John Bryan 06/22/2021, 1:03 PM

## 2021-06-22 NOTE — Progress Notes (Signed)
Occupational Therapy TBI Note  Patient Details  Name: John Bryan MRN: [id number] Date of Birth: 03-09-1951  Today's Date: 06/22/2021 OT Individual Time: 0815-0900 OT Individual Time Calculation (min): 45 min    Short Term Goals: Week 3:  OT Short Term Goal 1 (Week 3): Pt will demo improved activity tolerance by participating in 60 min of therapy/day OT Short Term Goal 2 (Week 3): Pt will complete stand pivot transfer to the toilet with mod A OT Short Term Goal 3 (Week 3): Pt will demo improved self regulation skills by not becoming agitated with therapy staff 50% of sessions  Skilled Therapeutic Interventions/Progress Updates:   Pt received supine with LPN administering medication via peg. Pt initially polite and agreeable to session but demonstrating low frustration tolerance and becoming angry/yelling when OT encouraged eating breakfast OOB. Despite further education and cueing re carryover to home (asked if pt will eat breakfast in bed at home, to which he stated "of course not" yet he could not be convinced to even try eating breakfast in the w/c or recliner). Pt completed bed mobility, scooting himself up with UE on headboard and BLE bent with min cueing and min A. HOB raised to 52 degrees and pt set up to eat breakfast. Full supervision provided. No overt s/s of aspiration, min cueing for liquid wash. Pt encouraged to complete oral care following eating, which he was able to with set up assist. Pt began bearing down and when questioned by OT what he was doing he stated "pooping". Unfortunate clearly learned helplessness behaviors as pt did not even attempt to alert OT to need to use bedpan. Copious education/stern instructions that is it unacceptable to willingly void BM into incontinence brief. Pt was assisted in peri hygiene supine with max A. He was left supine with all needs met, bed alarm set.   Therapy Documentation Precautions:  Precautions Precautions: Fall Precaution  Comments: G-tube, fall, L 5th digit WBAT, sinus precautions Restrictions Weight Bearing Restrictions: No LUE Weight Bearing: Weight bearing as tolerated  Agitated Behavior Scale: TBI Observation Details Observation Environment: Pt room Start of observation period - Date: 06/22/21 Start of observation period - Time: 0815 End of observation period - Date: 06/22/21 End of observation period - Time: 0900 Agitated Behavior Scale (DO NOT LEAVE BLANKS) Short attention span, easy distractibility, inability to concentrate: Absent Impulsive, impatient, low tolerance for pain or frustration: Present to a slight degree Uncooperative, resistant to care, demanding: Present to a moderate degree Violent and/or threatening violence toward people or property: Absent Explosive and/or unpredictable anger: Present to a slight degree Rocking, rubbing, moaning, or other self-stimulating behavior: Absent Pulling at tubes, restraints, etc.: Absent Wandering from treatment areas: Absent Restlessness, pacing, excessive movement: Absent Repetitive behaviors, motor, and/or verbal: Absent Rapid, loud, or excessive talking: Absent Sudden changes of mood: Present to a slight degree Easily initiated or excessive crying and/or laughter: Absent Self-abusiveness, physical and/or verbal: Absent Agitated behavior scale total score: 19     Therapy/Group: Individual Therapy  Curtis Sites 06/22/2021, 8:42 AM

## 2021-06-22 NOTE — Progress Notes (Signed)
Physical Therapy TBI Note  Patient Details  Name: John Bryan MRN: [id number] Date of Birth: October 18, 1951  Today's Date: 06/22/2021 PT Individual Time: 0930-1015 PT Individual Time Calculation (min): 45 min   Short Term Goals: Week 1:  PT Short Term Goal 1 (Week 1): Pt will transfer with mod assist  and LRAD to WC PT Short Term Goal 1 - Progress (Week 1): Progressing toward goal PT Short Term Goal 2 (Week 1): Pt will tolerate sitting in WC>2 hours between therapies PT Short Term Goal 2 - Progress (Week 1): Progressing toward goal PT Short Term Goal 3 (Week 1): Pt will ambulate 33f with mod assist and LRAD PT Short Term Goal 3 - Progress (Week 1): Progressing toward goal Week 2:  PT Short Term Goal 1 (Week 2): Pt will transfer with mod assist  and LRAD to WC PT Short Term Goal 1 - Progress (Week 2): Met PT Short Term Goal 2 (Week 2): Pt will tolerate sitting in WC 1 hour between therapies PT Short Term Goal 2 - Progress (Week 2): Progressing toward goal PT Short Term Goal 3 (Week 2): Pt will ambulate 240fwith mod assist and LRAD PT Short Term Goal 3 - Progress (Week 2): Met Week 3:  PT Short Term Goal 1 (Week 3): Patient will perform bed mobility with CGA consistently with use of hospital bed features. PT Short Term Goal 2 (Week 3): Patient will perform basic transfers with min A >50% of the time using LRAD. PT Short Term Goal 3 (Week 3): Patient will ambulate 50 ft with CGA using RW. PT Short Term Goal 4 (Week 3): Patient will initiate stair training. Week 4:     Skilled Therapeutic Interventions/Progress Updates:  Pt initially sleeping but easily awakens.  Agreeable to session w/focus on functional mobility. Lifts feet for therapist to don TEDs and socks. Supine to side w/cues, side to sit w/min assist.  Pt sits several min to adjust to position change.  Lifts feet for therapist to thread shorts to hips. Pt reports 2/10 back pain, treatment to tolerance, distraction and mobility  provided to  address. Sit to stand w/RW w/bed elevated and mod assist, cues for ant wt shift w/transition. Stands 1 min w/cga while therapist raises and secures shorts.  Returns to sitting due to fatigue, cues for safe hand placement. . Gait [id number], cga, flexed posture w/cues for upright and to remain safe distance within walker, shuffling gait, cues for safety w/turning/backing to wc for rest break in sitting.  Pt requires extended rest between gait trials. Turn/sit to edge of bed w/cues as above. Sit to supine w/mod assist for LE management due to back pain Pt left supine w/rails up x 3, alarm set, bed in lowest position, and needs in reach. Pt remains somewhat disoriented and confused but agreeable and appropriate.   Therapy Documentation Precautions:  Precautions Precautions: Fall Precaution Comments: G-tube, fall, L 5th digit WBAT, sinus precautions Restrictions Weight Bearing Restrictions: No LUE Weight Bearing: Weight bearing as tolerated  Agitated Behavior Scale: TBI Observation Details Observation Environment: pt room Start of observation period - Date: 06/22/21 Start of observation period - Time: 0930 End of observation period - Date: 06/22/21 End of observation period - Time: 1015 Agitated Behavior Scale (DO NOT LEAVE BLANKS) Short attention span, easy distractibility, inability to concentrate: Absent Impulsive, impatient, low tolerance for pain or frustration: Present to a slight degree Uncooperative, resistant to care, demanding: Absent Violent and/or threatening violence toward people or  property: Absent Explosive and/or unpredictable anger: Absent Rocking, rubbing, moaning, or other self-stimulating behavior: Absent Pulling at tubes, restraints, etc.: Absent Wandering from treatment areas: Absent Restlessness, pacing, excessive movement: Absent Repetitive behaviors, motor, and/or verbal: Absent Rapid, loud, or excessive talking: Absent Sudden changes of  mood: Present to a slight degree Easily initiated or excessive crying and/or laughter: Absent Self-abusiveness, physical and/or verbal: Absent Agitated behavior scale total score: 16      Therapy/Group: Individual Therapy Callie Fielding, [place] 06/22/2021, 12:11 PM

## 2021-06-23 DIAGNOSIS — E119 Type 2 diabetes mellitus without complications: Secondary | ICD-10-CM | POA: Diagnosis not present

## 2021-06-23 DIAGNOSIS — R1312 Dysphagia, oropharyngeal phase: Secondary | ICD-10-CM | POA: Diagnosis not present

## 2021-06-23 DIAGNOSIS — S069X3S Unspecified intracranial injury with loss of consciousness of 1 hour to 5 hours 59 minutes, sequela: Secondary | ICD-10-CM | POA: Diagnosis not present

## 2021-06-23 DIAGNOSIS — A0472 Enterocolitis due to Clostridium difficile, not specified as recurrent: Secondary | ICD-10-CM | POA: Diagnosis not present

## 2021-06-23 LAB — GLUCOSE, CAPILLARY
Glucose-Capillary: 86 mg/dL (ref 70–99)
Glucose-Capillary: 135 mg/dL — ABNORMAL HIGH (ref 70–99)
Glucose-Capillary: 117 mg/dL — ABNORMAL HIGH (ref 70–99)
Glucose-Capillary: 99 mg/dL (ref 70–99)

## 2021-06-23 LAB — BASIC METABOLIC PANEL
Anion gap: 9 (ref 5–15)
BUN: 10 mg/dL (ref 8–23)
CO2: 24 mmol/L (ref 22–32)
Calcium: 8.9 mg/dL (ref 8.9–10.3)
Chloride: 102 mmol/L (ref 98–111)
Creatinine, Ser: 0.86 mg/dL (ref 0.61–1.24)
GFR, Estimated: >60 mL/min (ref >60)
Glucose, Bld: 170 mg/dL — ABNORMAL HIGH (ref 70–99)
Potassium: 4.7 mmol/L (ref 3.5–5.1)
Sodium: 135 mmol/L (ref 135–145)

## 2021-06-23 NOTE — Progress Notes (Addendum)
Occupational Therapy TBI Note  Patient Details  Name: John Bryan MRN: [id number] Date of Birth: [birth year]  Today's Date: 06/23/2021 OT Individual Time: 0900-1000 OT Individual Time Calculation (min): 60 min    Short Term Goals: Week 1:  OT Short Term Goal 1 (Week 1): Pt will tolerate EOB ADLs with no supine rest breaks to increase functional activity tolerance OT Short Term Goal 1 - Progress (Week 1): Met OT Short Term Goal 2 (Week 1): Pt will don pants with max A OT Short Term Goal 2 - Progress (Week 1): Progressing toward goal OT Short Term Goal 3 (Week 1): Pt will complete sit > stand for toileting tasks with max A OT Short Term Goal 3 - Progress (Week 1): Met OT Short Term Goal 4 (Week 1): Pt will demonstrate improved intellectual awareness of deficits with mod cueing OT Short Term Goal 4 - Progress (Week 1): Met  Skilled Therapeutic Interventions/Progress Updates:     Pt received in bed with unrated back pain. Heat and pillow provided for support in w/c  ADL:  Pt completes grooming seated in w/c at sink with set up. Attempted to engage pt in toilet transfer training, but pt states, "I was on one of those this morning and im not doing it again.   Therapeutic activity CGA bed mobility with trunk elevation only. Encouragement provided to participate d/t back pain. Heat pack provided for 20 min during session a well as pillow for back support. Pt completes SPT with RW with MOD A to power up from elevated surface with RW and CGA for pivot steps to w/c.  Pt completes functional mobility 2x20 feet with RW to EOM with VC for RW amnagment during transfers. 1 round standing bean bag toss and 1 round seated bean bag toss completed. Functional mobility with RW in room with MIN A to power up as stated above back to bed  Pt left at end of session in bed with exit alarm on, call light in reach and all needs met   Therapy Documentation Precautions:  Precautions Precautions:  Fall Precaution Comments: G-tube, fall, L 5th digit WBAT, sinus precautions Restrictions Weight Bearing Restrictions: No LUE Weight Bearing: Weight bearing as tolerated General:   Vital Signs: Therapy Vitals Temp: 98.2 F (36.8 C) Pulse Rate: (!) 59 Resp: 18 BP: 138/72 Patient Position (if appropriate): Lying Oxygen Therapy SpO2: 97 % O2 Device: Room Air Pain:   Agitated Behavior Scale: TBI  Observation Details Observation Environment: CIR Start of observation period - Date: 06/23/21 Start of observation period - Time: 0900 End of observation period - Date: 06/23/21 End of observation period - Time: 0959 Agitated Behavior Scale (DO NOT LEAVE BLANKS) Short attention span, easy distractibility, inability to concentrate: Absent Impulsive, impatient, low tolerance for pain or frustration: Present to a moderate degree Uncooperative, resistant to care, demanding: Present to a slight degree Violent and/or threatening violence toward people or property: Absent Explosive and/or unpredictable anger: Absent Rocking, rubbing, moaning, or other self-stimulating behavior: Absent Pulling at tubes, restraints, etc.: Absent Wandering from treatment areas: Absent Restlessness, pacing, excessive movement: Absent Repetitive behaviors, motor, and/or verbal: Absent Rapid, loud, or excessive talking: Absent Sudden changes of mood: Absent Easily initiated or excessive crying and/or laughter: Absent Self-abusiveness, physical and/or verbal: Absent Agitated behavior scale total score: 17  ADL: ADL Eating: NPO Grooming: Contact guard Where Assessed-Grooming: Edge of bed Upper Body Bathing: Minimal assistance Where Assessed-Upper Body Bathing: Edge of bed Lower Body Bathing: Maximal assistance Where Assessed-Lower  Body Bathing: Bed level Upper Body Dressing: Minimal assistance Where Assessed-Upper Body Dressing: Edge of bed Lower Body Dressing: Dependent Where Assessed-Lower Body  Dressing: Bed level Toileting: Dependent Where Assessed-Toileting: Bed level Toilet Transfer: Unable to assess Tub/Shower Transfer: Unable to assess Vision   Perception    Praxis   Exercises:   Other Treatments:     Therapy/Group: Individual Therapy  Tonny Branch 06/23/2021, 10:00 AM

## 2021-06-23 NOTE — Progress Notes (Signed)
Speech Language Pathology Weekly Progress and Session Note  Patient Details  Name: John Bryan MRN: [id number] Date of Birth: January 09, 1951  Beginning of progress report period: June 17, 2021 End of progress report period: June 23, 2021  Today's Date: 06/23/2021 SLP Individual Time: 1300-1330 SLP Individual Time Calculation (min): 30 min  Short Term Goals: Week 3: SLP Short Term Goal 1 (Week 3): Patient will demonstrate efficient mastication and complete oral clearance with trials of Dys. 3 textures with minimal overt s/s of aspiration over 2 sessions prior to upgrade. SLP Short Term Goal 1 - Progress (Week 3): Not met SLP Short Term Goal 2 (Week 3): Patient will demonstrate sustained attention to functional tasks fo 10 minutes with Mod verbal cues for redirection. SLP Short Term Goal 2 - Progress (Week 3): Met SLP Short Term Goal 3 (Week 3): Patient will orient to time, place, situation using external aides with min verbal and visual cues. SLP Short Term Goal 3 - Progress (Week 3): Met SLP Short Term Goal 4 (Week 3): Patient will demonstrate awareness to errors when completing basic level problem solving tasks with min verbal cues. SLP Short Term Goal 4 - Progress (Week 3): Met    New Short Term Goals: Week 4: SLP Short Term Goal 1 (Week 4): Patient will demonstrate efficient mastication and complete oral clearance with trials of Dys. 3 textures with minimal overt s/s of aspiration over 2 sessions prior to upgrade. SLP Short Term Goal 2 (Week 4): Patient will consume current diet with minimal overt s/s of aspiration with Mod I for use of swallowing compensatory strategies. SLP Short Term Goal 3 (Week 4): Patient will demonstrate sustained attention to functional tasks fo 30 minutes with Min verbal cues for redirection. SLP Short Term Goal 4 (Week 4): Patient will orient to time, place, situation using external aides with supervision verbal and visual cues. SLP Short Term Goal 5 (Week  4): Patient will demonstrate awareness to errors when completing basic level problem solving tasks with supevision verbal cues.  Weekly Progress Updates: Patient has made functional but inconsistent gains and has met 3 of 4 STGs this reporting period. Currently, patient demonstrates behaviors consistent with a [place]o Level VI and requires overall Min-Mod A multimodal cues to complete functional and familiar tasks safely in regards to problem solving, attention, and orientation. Patient demonstrates improved participation and improved frustration tolerance but overall cognitive functioning can fluctuate at times. Function is also impacted by patient being hard of hearing with inability to wear his hearing aids at this time. Patient is also unable to tolerate his dentures at this time secondary to them being too loose despite adhesive. Patient is currently consuming Dys. 2 textures with thin liquids with intermittent overt s/s of aspiration with supervision level verbal cues needed for an upright posture and to clear oral residue. Patient with prolonged mastication og Dys. 3 textures.Patient and family education ongoing. Patient would benefit from continued skilled SLP intervention to maximize his cognitive and swallowing function prior to discharge.       Intensity: Minumum of 1-2 x/day, 30 to 90 minutes Frequency: 3 to 5 out of 7 days Duration/Length of Stay: 07/05/21 Treatment/Interventions: Cognitive remediation/compensation;Dysphagia/aspiration precaution training;Internal/external aids;Cueing hierarchy;Functional tasks;Patient/family education;Therapeutic Activities;Environmental controls   Daily Session  Skilled Therapeutic Interventions:  Skilled treatment session focused on cognitive goals. Upon arrival, patient's daughter present and educated regarding patient's current cognitive functioning and current plan of care/SLP goals. She verbalized understanding and agreement. Patient's glasses had been  misplaced  resulting in patient unable to read visual aids to maximize recall and orientation. Patient also continues to require overall Max A multimodal cues to recall events from previous therapy sessions. Patient mildly focused on pain today but was easily redirected. Patient left upright in bed with alarm on and all needs within reach. Continue with current plan of care.     Pain Reports pain in back and neck. Patient premedicated and repositioned   Therapy/Group: Individual Therapy  Keeghan Bialy 06/23/2021, 6:28 AM

## 2021-06-23 NOTE — Progress Notes (Signed)
Physical Therapy TBI Note  Patient Details  Name: John Bryan MRN: [id number] Date of Birth: [birth year]  Today's Date: 06/23/2021 PT Individual Time: 0810-0850 PT Individual Time Calculation (min): 40 min   Short Term Goals: Week 3:  PT Short Term Goal 1 (Week 3): Patient will perform bed mobility with CGA consistently with use of hospital bed features. PT Short Term Goal 2 (Week 3): Patient will perform basic transfers with min A >50% of the time using LRAD. PT Short Term Goal 3 (Week 3): Patient will ambulate 50 ft with CGA using RW. PT Short Term Goal 4 (Week 3): Patient will initiate stair training.  Skilled Therapeutic Interventions/Progress Updates:     Patient in bed eating breakfast and RN preparing meds upon PT arrival. Patient alert and agreeable to PT session. Patient reported 6/10 back pain during session, RN made aware. PT provided repositioning, rest breaks, and distraction as pain interventions throughout session. Started session 10 min late due to patient with med admin and finishing breakfast.   Therapeutic Activity: Bed Mobility: Patient performed supine to sit with CGA and sit to supine with min-mod A for lower extremity management. Provided verbal cues for log roll to reduce back pain and bringing knees to chest to lift lower extremities onto the bed. Transfers: Patient performed sit to/from stand from an elevated bed using RW with min A and x2 attempts due to decreased forward weight shift and progressed to pivot to [name] with CGA. Patient was unable to stand from [facility] using RW, performed dependent transfer back to bed with mod A +2 to stand in the [place]. Provided verbal cues for scooting forward, forward weight shift, and tactile cues for gluteal activation to come to standing. Patient was continent of bowl on BSC and of bladder in standing with use of urinal with total A for placement. Performed peri-care and lower body clothing management with total A as  well.  Patient in bed at end of session with breaks locked, bed alarm set, and all needs within reach.   Therapy Documentation Precautions:  Precautions Precautions: Fall Precaution Comments: G-tube, fall, L 5th digit WBAT, sinus precautions Restrictions Weight Bearing Restrictions: No LUE Weight Bearing: Weight bearing as tolerated  Agitated Behavior Scale: TBI Observation Details Observation Environment: Patient's room Start of observation period - Date: 06/23/21 Start of observation period - Time: 0800 End of observation period - Date: 06/23/21 End of observation period - Time: 0845 Agitated Behavior Scale (DO NOT LEAVE BLANKS) Short attention span, easy distractibility, inability to concentrate: Present to a slight degree Impulsive, impatient, low tolerance for pain or frustration: Present to a slight degree Uncooperative, resistant to care, demanding: Present to a slight degree Violent and/or threatening violence toward people or property: Absent Explosive and/or unpredictable anger: Absent Rocking, rubbing, moaning, or other self-stimulating behavior: Absent Pulling at tubes, restraints, etc.: Absent Wandering from treatment areas: Absent Restlessness, pacing, excessive movement: Absent Repetitive behaviors, motor, and/or verbal: Absent Rapid, loud, or excessive talking: Absent Sudden changes of mood: Absent Easily initiated or excessive crying and/or laughter: Absent Self-abusiveness, physical and/or verbal: Absent Agitated behavior scale total score: 17    Therapy/Group: Individual Therapy  John Bryan L John Bryan PT, DPT  06/23/2021, 12:14 PM
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
## 2021-06-23 NOTE — Plan of Care (Signed)
  Problem: RH Cognition - SLP Goal: RH LTG Patient will demonstrate orientation with cues Description:  LTG:  Patient will demonstrate orientation to person/place/time/situation with cues (SLP)   Flowsheets (Taken 06/23/2021 [phone]) LTG: Patient will demonstrate orientation using cueing (SLP): Minimal Assistance - Patient > 75% Note: Goal downgraded due to inconsistent progress   Problem: RH Memory Goal: LTG Patient will use memory compensatory aids to (SLP) Description: LTG:  Patient will use memory compensatory aids to recall biographical/new, daily complex information with cues (SLP) Flowsheets (Taken 06/23/2021 [id number]) LTG: Patient will use memory compensatory aids to (SLP): Minimal Assistance - Patient > 75% Note: Goal downgraded due to inconsistent progress   Problem: RH Awareness Goal: LTG: Patient will demonstrate awareness during functional activites type of (SLP) Description: LTG: Patient will demonstrate awareness during functional activites type of (SLP) Flowsheets (Taken 06/23/2021 [id number]) Patient will demonstrate during cognitive/linguistic activities awareness type of: Emergent LTG: Patient will demonstrate awareness during cognitive/linguistic activities with assistance of (SLP): Minimal Assistance - Patient > 75% Note: Goal downgraded due to inconsistent progress

## 2021-06-24 DIAGNOSIS — R1312 Dysphagia, oropharyngeal phase: Secondary | ICD-10-CM | POA: Diagnosis not present

## 2021-06-24 DIAGNOSIS — S069X3S Unspecified intracranial injury with loss of consciousness of 1 hour to 5 hours 59 minutes, sequela: Secondary | ICD-10-CM | POA: Diagnosis not present

## 2021-06-24 DIAGNOSIS — A0472 Enterocolitis due to Clostridium difficile, not specified as recurrent: Secondary | ICD-10-CM | POA: Diagnosis not present

## 2021-06-24 DIAGNOSIS — E119 Type 2 diabetes mellitus without complications: Secondary | ICD-10-CM | POA: Diagnosis not present

## 2021-06-24 LAB — GLUCOSE, CAPILLARY
Glucose-Capillary: 94 mg/dL (ref 70–99)
Glucose-Capillary: 139 mg/dL — ABNORMAL HIGH (ref 70–99)
Glucose-Capillary: 142 mg/dL — ABNORMAL HIGH (ref 70–99)
Glucose-Capillary: 72 mg/dL (ref 70–99)

## 2021-06-24 MED ORDER — TRAMADOL HCL 50 MG PO TABS
100.0000 mg | ORAL_TABLET | Freq: Two times a day (BID) | ORAL | Status: DC
  Administered 2021-06-24 – 2021-06-26 (×4): 100 mg
  Filled 2021-06-24 (×4): qty 2

## 2021-06-24 MED ORDER — ONDANSETRON HCL 4 MG PO TABS: 4.0000 mg | ORAL_TABLET | Freq: Three times a day (TID) | ORAL | Status: DC | PRN

## 2021-06-24 MED ORDER — TOPIRAMATE 25 MG PO TABS
25.0000 mg | ORAL_TABLET | Freq: Two times a day (BID) | ORAL | Status: DC
  Administered 2021-06-24 – 2021-07-05 (×23): 25 mg via ORAL
  Filled 2021-06-24 (×23): qty 1

## 2021-06-24 MED ORDER — MIRTAZAPINE 15 MG PO TABS
7.5000 mg | ORAL_TABLET | Freq: Every day | ORAL | Status: DC
  Administered 2021-06-24 – 2021-06-25 (×2): 7.5 mg
  Filled 2021-06-24 (×2): qty 1

## 2021-06-24 MED ORDER — METHOCARBAMOL 500 MG PO TABS
500.0000 mg | ORAL_TABLET | Freq: Three times a day (TID) | ORAL | Status: DC
  Administered 2021-06-24 – 2021-06-26 (×6): 500 mg
  Filled 2021-06-24 (×6): qty 1

## 2021-06-24 MED ORDER — QUETIAPINE FUMARATE 25 MG PO TABS
25.0000 mg | ORAL_TABLET | Freq: Two times a day (BID) | ORAL | Status: DC
  Administered 2021-06-24 – 2021-06-26 (×4): 25 mg
  Filled 2021-06-24 (×4): qty 1

## 2021-06-24 NOTE — Progress Notes (Signed)
Physical Therapy Session Note  Patient Details  Name: LOGHAN KURTZMAN MRN: [id number] Date of Birth: [birth year]  Today's Date: 06/24/2021 PT Individual Time: 1425-1530 PT Individual Time Calculation (min): 65 min   Short Term Goals: Week 1:  PT Short Term Goal 1 (Week 1): Pt will transfer with mod assist  and LRAD to WC PT Short Term Goal 1 - Progress (Week 1): Progressing toward goal PT Short Term Goal 2 (Week 1): Pt will tolerate sitting in WC>2 hours between therapies PT Short Term Goal 2 - Progress (Week 1): Progressing toward goal PT Short Term Goal 3 (Week 1): Pt will ambulate 14f with mod assist and LRAD PT Short Term Goal 3 - Progress (Week 1): Progressing toward goal Week 2:  PT Short Term Goal 1 (Week 2): Pt will transfer with mod assist  and LRAD to WC PT Short Term Goal 1 - Progress (Week 2): Met PT Short Term Goal 2 (Week 2): Pt will tolerate sitting in WC 1 hour between therapies PT Short Term Goal 2 - Progress (Week 2): Progressing toward goal PT Short Term Goal 3 (Week 2): Pt will ambulate 234fwith mod assist and LRAD PT Short Term Goal 3 - Progress (Week 2): Met Week 3:  PT Short Term Goal 1 (Week 3): Patient will perform bed mobility with CGA consistently with use of hospital bed features. PT Short Term Goal 2 (Week 3): Patient will perform basic transfers with min A >50% of the time using LRAD. PT Short Term Goal 3 (Week 3): Patient will ambulate 50 ft with CGA using RW. PT Short Term Goal 4 (Week 3): Patient will initiate stair training. Week 4:     Skilled Therapeutic Interventions/Progress Updates:    Pain:  Pt reports back pain.  Treatment to tolerance.  Rest breaks and repositioning as needed.  Mobilty and distraction to address.  Pt initially supine and agreeable to treatment session w/focus on family training.  Pt wife, daughter, son-in-law present for session.  Daughter most involved in hands on training.  Family voiced many times concerns over pt being  ready for DC by planned date.  Discussed LOS in IPR and to expect pt to make further improvements in function prior to DC date.  Also suggested family return for continued training as pt progresses. Discussed the following: Equipment needs - including wc, cushion, RW, possible hospital bed, possible ramp.  Discussed home set up and requested family provide home measurements and provide pictures for therapists to aid w/prep for DC home.   Pt family described home as having 4 STE w/L handrail, 2 steps foyer to main living area (includes all needs) but no handrail for interior steps.   Discussed proper guarding and assistance for bed mobility, Sit to stand w/RW, transfers to wc, gait, and stairs.  Daughter and wife posed as pt during these activities and therapist demonstrated guarding as well as body positioning for recovery of balance loss. Also discussed safety issues and cueing required for safe management of RW during transfers and gait.   Daughter engaged in actual pt guarding w/bed mobility, transfers, and gait and demonstrated good understanding, technique w/these actvties.  Pt ambulated [id number], [id number] w/RW, cga.cues for upright posture, cues to remain safe distance within walker, cues for walker management w/turning/backing to wc.   Once in wc, pt transported to gym for continued session.  Therapist demonstrated guarding using son in law as pt and explaining step by step body mechanics for  caregiver and pt safety w/ascending/descending 4 stairs.    Pt Sit to stand w/min to mod assist, cues for momentum, cues for mechanics at stairs. Attempted to ascend single step leading L then after failed attempt w/L, leading R but pt unable to power up w/either LE.  Returned to sitting.   Educated pt/familiy that this would be a focus on further sessions, however a ramp may be more realistic option for home entry.  Interior steps w/no rail may require wc bump up but pictures/measurements would facilitate  proper plan.  Wife agreed to provide.   Pt transported to room.  Daughter assisted w/cues from therapist Sit to stand and stand pivot transfer to bed.  Discussed likely need for hospital bed and suggested family consider this/discuss home set up.  Will also need measurements of doorway for wc accessibility/perscription.  Family again agreed to provide. Pt left supine w/rails up x 4 alarm set, bed in lowest position, family at bedside and needs in reach.   Therapy Documentation Precautions:  Precautions Precautions: Fall Precaution Comments: G-tube, fall, L 5th digit WBAT, sinus precautions Restrictions Weight Bearing Restrictions: No LUE Weight Bearing: Weight bearing as tolerated     Therapy/Group: Individual Therapy Callie Fielding, [place] 06/24/2021, 4:47 PM

## 2021-06-24 NOTE — Progress Notes (Signed)
Occupational Therapy Session Note  Patient Details  Name: John Bryan MRN: [id number] Date of Birth: Mar 20, 1951  Today's Date: 06/24/2021 OT Individual Time: 1300-1345 OT Individual Time Calculation (min): 45 min    Short Term Goals: Week 2:  OT Short Term Goal 1 (Week 2): Pt will complete transfer to [facility] with mod A OT Short Term Goal 1 - Progress (Week 2): Not met OT Short Term Goal 2 (Week 2): Pt will demonstrate improved activity tolerance by participating in >120 min of therapy/day OT Short Term Goal 2 - Progress (Week 2): Not met OT Short Term Goal 3 (Week 2): Pt will complete UB ADLs with set up assist only EOB OT Short Term Goal 3 - Progress (Week 2): Not met  Skilled Therapeutic Interventions/Progress Updates:     Pt received in bed with unratedpain in neck. Ice pack provided at end of session  Family Education:  LPN present providing medicaitons. Daughter, significant other and son in law present for family ed. OT discusses CLOF and predicted LOF of MIN A at DC d/t pt limited participation in tx. OT educates on functional mobility, transfers, ADL performance, statning balance, gait belt use, body mechanics of providing MIN A and pt body mechanics to improve transitional movemnent. PT and OT demo LB dressing, brief change with pt washing peri area after incontient of both bowel and bladder in brief. OT provides total A for hygeine. Pt demo transfer with with RW to significantly elevated BSC from elevate EOB. Pt able ot complete multiple trials of sit to stand with each family member with OT coaching on forward weight shift facilitation to improve pt STS. Significant other and daughter needing multiple trials to complete with min A. Recommend continued family ed closer to DC if possible.   Pt left at end of session in bed with exit alarm on, call light in reach and all needs met   Therapy Documentation Precautions:  Precautions Precautions: Fall Precaution Comments:  G-tube, fall, L 5th digit WBAT, sinus precautions Restrictions Weight Bearing Restrictions: No LUE Weight Bearing: Weight bearing as tolerated General:   Vital Signs: Therapy Vitals Temp: 98.6 F (37 C) Pulse Rate: 60 Resp: 17 BP: (!) 141/64 Patient Position (if appropriate): Lying Oxygen Therapy SpO2: 100 % O2 Device: Room Air Pain:   ADL: ADL Eating: NPO Grooming: Contact guard Where Assessed-Grooming: Edge of bed Upper Body Bathing: Minimal assistance Where Assessed-Upper Body Bathing: Edge of bed Lower Body Bathing: Maximal assistance Where Assessed-Lower Body Bathing: Bed level Upper Body Dressing: Minimal assistance Where Assessed-Upper Body Dressing: Edge of bed Lower Body Dressing: Dependent Where Assessed-Lower Body Dressing: Bed level Toileting: Dependent Where Assessed-Toileting: Bed level Toilet Transfer: Unable to assess Tub/Shower Transfer: Unable to assess Vision   Perception    Praxis   Exercises:   Other Treatments:     Therapy/Group: Individual Therapy  Tonny Branch 06/24/2021, 6:45 AM
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
## 2021-06-24 NOTE — Progress Notes (Signed)
Speech Language Pathology TBI Note  Patient Details  Name: John Bryan MRN: [id number] Date of Birth: 06-12-1951  Today's Date: 06/24/2021 SLP Individual Time: 1345-1425 SLP Individual Time Calculation (min): 40 min  Short Term Goals: Week 4: SLP Short Term Goal 1 (Week 4): Patient will demonstrate efficient mastication and complete oral clearance with trials of Dys. 3 textures with minimal overt s/s of aspiration over 2 sessions prior to upgrade. SLP Short Term Goal 2 (Week 4): Patient will consume current diet with minimal overt s/s of aspiration with Mod I for use of swallowing compensatory strategies. SLP Short Term Goal 3 (Week 4): Patient will demonstrate sustained attention to functional tasks fo 30 minutes with Min verbal cues for redirection. SLP Short Term Goal 4 (Week 4): Patient will orient to time, place, situation using external aides with supervision verbal and visual cues. SLP Short Term Goal 5 (Week 4): Patient will demonstrate awareness to errors when completing basic level problem solving tasks with supevision verbal cues.  Skilled Therapeutic Interventions: Patient agreeable to skilled ST intervention with focus on family education. Spouse, daughter, and son-in-law were present for family education. Educated on patient's current swallow function, recommendations for Dys 2 diet, and safe swallow precautions and strategies. Handout provided containing information of Dys 2 texture preparation, with discussion on foods/consistencies to avoid. Nurse inquired about PO medications. Recommend crushed meds in puree at this time. Notified MD of recommendations. Family in agreement. Also educated on progress with speech therapy, continued areas of growth, cognitive-communication, and memory strategies. SLP referenced handouts which were provided containing strategies and further information on cognitive-communication disorders, attention, and memory strategies. All questions were  addressed to satisfaction per family. Spouse and daughter demonstrated understanding of education through teach back. Patient was passed off to PT at end of session. Continue per current plan of care.      Pain Pain Assessment Pain Scale: 0-10 Pain Score: 0-No pain  Agitated Behavior Scale: TBI Observation Details Observation Environment: patient room Start of observation period - Date: 06/24/21 Start of observation period - Time: 1345 End of observation period - Date: 06/24/21 End of observation period - Time: 1425 Agitated Behavior Scale (DO NOT LEAVE BLANKS) Short attention span, easy distractibility, inability to concentrate: Present to a slight degree Impulsive, impatient, low tolerance for pain or frustration: Present to a slight degree Uncooperative, resistant to care, demanding: Present to a slight degree Violent and/or threatening violence toward people or property: Absent Explosive and/or unpredictable anger: Absent Rocking, rubbing, moaning, or other self-stimulating behavior: Absent Pulling at tubes, restraints, etc.: Absent Wandering from treatment areas: Absent Restlessness, pacing, excessive movement: Absent Repetitive behaviors, motor, and/or verbal: Absent Rapid, loud, or excessive talking: Absent Sudden changes of mood: Absent Easily initiated or excessive crying and/or laughter: Absent Self-abusiveness, physical and/or verbal: Absent Agitated behavior scale total score: 17  Therapy/Group: Individual Therapy  [name] 06/24/2021, 4:02 PM

## 2021-06-25 DIAGNOSIS — S069X0S Unspecified intracranial injury without loss of consciousness, sequela: Secondary | ICD-10-CM | POA: Diagnosis not present

## 2021-06-25 DIAGNOSIS — I10 Essential (primary) hypertension: Secondary | ICD-10-CM | POA: Diagnosis not present

## 2021-06-25 DIAGNOSIS — G44311 Acute post-traumatic headache, intractable: Secondary | ICD-10-CM | POA: Diagnosis not present

## 2021-06-25 LAB — GLUCOSE, CAPILLARY
Glucose-Capillary: 113 mg/dL — ABNORMAL HIGH (ref 70–99)
Glucose-Capillary: 109 mg/dL — ABNORMAL HIGH (ref 70–99)
Glucose-Capillary: 165 mg/dL — ABNORMAL HIGH (ref 70–99)
Glucose-Capillary: 190 mg/dL — ABNORMAL HIGH (ref 70–99)

## 2021-06-25 MED ORDER — CLONIDINE HCL 0.1 MG PO TABS
0.1000 mg | ORAL_TABLET | Freq: Three times a day (TID) | ORAL | Status: DC
  Administered 2021-06-25 – 2021-06-26 (×3): 0.1 mg
  Filled 2021-06-25 (×3): qty 1
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
## 2021-06-25 NOTE — Progress Notes (Signed)
Pt able to take small tabs PO in applesauce. All meds bigger than eliquis crushed in applesauce.

## 2021-06-25 NOTE — Progress Notes (Signed)
Physical Therapy Session Note  Patient Details  Name: John Bryan MRN: [id number] Date of Birth: 06/11/1951  Today's Date: 06/25/2021 PT Individual Time: 0932-6712 PT Individual Time Calculation (min): 43 min   Short Term Goals: Week 3:  PT Short Term Goal 1 (Week 3): Patient will perform bed mobility with CGA consistently with use of hospital bed features. PT Short Term Goal 2 (Week 3): Patient will perform basic transfers with min A >50% of the time using LRAD. PT Short Term Goal 3 (Week 3): Patient will ambulate 50 ft with CGA using RW. PT Short Term Goal 4 (Week 3): Patient will initiate stair training.  Skilled Therapeutic Interventions/Progress Updates: Pt presents asleep and arouses and agreeable to therapy.  Pt required verbal cues for log roll technique and CGA.  Pt requires min A for sidelying to sit w/ cueing and c/o increased LBP, but encouraged not to stop 1/2 transfer.  Pt required min A from elevated bed and then amb to w/c x 4' including turn.  Pt requires verbal cues for foot clearance.  Pt wheeled to gym for time conservation.  Pt transferred sit to stand w/ min to occasional mod A, depending upon initiation.  Pt requires verbal cues for scooting forwad and then forward lean.  Pt amb multiple trials of 30' w/ RW and min A.  Pt states need to urinate, states urinating in brief.  Pt returned to room and amb from hallway x 20' to side of bed.  Pt required mod A to doff pants and total A for brief.  NT donned brief while pt standing w/ CGA.  Pt transferred sit to L sidelying w/ min to mod S and then rolled to supine w/ CGA.  Pt positioned in bed to comfort.  Bed alarm on and all needs in reach.     Therapy Documentation Precautions:  Precautions Precautions: Fall Precaution Comments: G-tube, fall, L 5th digit WBAT, sinus precautions Restrictions Weight Bearing Restrictions: No LUE Weight Bearing: Weight bearing as tolerated General:   Vital Signs: Therapy Vitals Temp:  98.6 F (37 C) Temp Source: Oral Pulse Rate: 64 Resp: 17 BP: 105/60 Patient Position (if appropriate): Lying Oxygen Therapy SpO2: 98 % O2 Device: Room Air Pain:7/10 pain LB Pain Assessment Pain Scale: 0-10 Pain Score: 0-No pain Mobility:       Therapy/Group: Individual Therapy  Lucio Edward 06/25/2021, 3:45 PM

## 2021-06-25 NOTE — Progress Notes (Signed)
Speech Language Pathology TBI Note  Patient Details  Name: John Bryan MRN: [id number] Date of Birth: 10-06-1951  Today's Date: 06/25/2021 SLP Individual Time: 1115-1200 SLP Individual Time Calculation (min): 45 min  Short Term Goals: Week 4: SLP Short Term Goal 1 (Week 4): Patient will demonstrate efficient mastication and complete oral clearance with trials of Dys. 3 textures with minimal overt s/s of aspiration over 2 sessions prior to upgrade. SLP Short Term Goal 2 (Week 4): Patient will consume current diet with minimal overt s/s of aspiration with Mod I for use of swallowing compensatory strategies. SLP Short Term Goal 3 (Week 4): Patient will demonstrate sustained attention to functional tasks fo 30 minutes with Min verbal cues for redirection. SLP Short Term Goal 4 (Week 4): Patient will orient to time, place, situation using external aides with supervision verbal and visual cues. SLP Short Term Goal 5 (Week 4): Patient will demonstrate awareness to errors when completing basic level problem solving tasks with supevision verbal cues.  Skilled Therapeutic Interventions: Patient agreeable to skilled ST intervention with focus on cognitive goals. Per discussion with nurse and MD on 7/22, patient was given PO medications crushed in applesauce this a.m. One very small pill was given whole in applesauce. SLP was not present to witness, however Nurse reported patient tolerated well with no overt s/sx of aspiration. Recommend continuing crushed meds at this time. May continue to trial SMALL pills in applesauce ONE-AT-A-TIME as tolerated. Facilitated working memory and attention task involving immediate recall of lists containing 5 4-5 words and answering questions about their features from [facility] cognitive rehab workbook. Patient completed task with min-to-mod A verbal repetition with 70% accuracy. Patient most consistently recalled 3/5 words and required repetition to recall full word list. Task  was somewhat complicated by hard of hearing status however patient tolerated with minimal-to-no signs of frustration. Patient corrected errors with sup A verbal cues. Patient was left in _ with alarm activated and immediate needs within reach at end of session. Continue per current plan of care.       Pain Pain Assessment Pain Scale: 0-10 Pain Score: 0-No pain  Agitated Behavior Scale: TBI Observation Details Observation Environment: pt room Start of observation period - Date: 06/25/21 Start of observation period - Time: 1115 End of observation period - Date: 06/25/21 End of observation period - Time: 1200 Agitated Behavior Scale (DO NOT LEAVE BLANKS) Short attention span, easy distractibility, inability to concentrate: Present to a slight degree Impulsive, impatient, low tolerance for pain or frustration: Present to a slight degree Uncooperative, resistant to care, demanding: Absent Violent and/or threatening violence toward people or property: Absent Explosive and/or unpredictable anger: Absent Rocking, rubbing, moaning, or other self-stimulating behavior: Absent Pulling at tubes, restraints, etc.: Absent Wandering from treatment areas: Absent Restlessness, pacing, excessive movement: Absent Repetitive behaviors, motor, and/or verbal: Absent Rapid, loud, or excessive talking: Absent Sudden changes of mood: Absent Easily initiated or excessive crying and/or laughter: Absent Self-abusiveness, physical and/or verbal: Absent Agitated behavior scale total score: 16  Therapy/Group: Individual Therapy  [name] 06/25/2021, 12:40 PM
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
## 2021-06-26 DIAGNOSIS — S069X0S Unspecified intracranial injury without loss of consciousness, sequela: Secondary | ICD-10-CM | POA: Diagnosis not present

## 2021-06-26 DIAGNOSIS — G44311 Acute post-traumatic headache, intractable: Secondary | ICD-10-CM | POA: Diagnosis not present

## 2021-06-26 DIAGNOSIS — I10 Essential (primary) hypertension: Secondary | ICD-10-CM | POA: Diagnosis not present

## 2021-06-26 LAB — GLUCOSE, CAPILLARY
Glucose-Capillary: 122 mg/dL — ABNORMAL HIGH (ref 70–99)
Glucose-Capillary: 99 mg/dL (ref 70–99)
Glucose-Capillary: 121 mg/dL — ABNORMAL HIGH (ref 70–99)
Glucose-Capillary: 112 mg/dL — ABNORMAL HIGH (ref 70–99)

## 2021-06-26 MED ORDER — VANCOMYCIN 50 MG/ML ORAL SOLUTION: 125.0000 mg | ORAL | Status: DC

## 2021-06-26 MED ORDER — MUSCLE RUB 10-15 % EX CREA
TOPICAL_CREAM | Freq: Two times a day (BID) | CUTANEOUS | Status: DC | PRN
  Filled 2021-06-26: qty 85

## 2021-06-26 MED ORDER — POTASSIUM CHLORIDE 20 MEQ PO PACK
40.0000 meq | PACK | Freq: Two times a day (BID) | ORAL | Status: DC
  Administered 2021-06-26 – 2021-07-05 (×18): 40 meq via ORAL
  Filled 2021-06-26 (×18): qty 2

## 2021-06-26 MED ORDER — ONDANSETRON HCL 4 MG PO TABS: 4.0000 mg | ORAL_TABLET | Freq: Three times a day (TID) | ORAL | Status: DC | PRN

## 2021-06-26 MED ORDER — VITAMIN D 25 MCG (1000 UNIT) PO TABS
1000.0000 [IU] | ORAL_TABLET | Freq: Every day | ORAL | Status: DC
  Administered 2021-06-27 – 2021-07-05 (×9): 1000 [IU] via ORAL
  Filled 2021-06-26 (×9): qty 1

## 2021-06-26 MED ORDER — MIRTAZAPINE 15 MG PO TABS
7.5000 mg | ORAL_TABLET | Freq: Every day | ORAL | Status: DC
  Administered 2021-06-26 – 2021-07-03 (×8): 7.5 mg via ORAL
  Filled 2021-06-26 (×8): qty 1

## 2021-06-26 MED ORDER — LEVOTHYROXINE SODIUM 75 MCG PO TABS
75.0000 ug | ORAL_TABLET | Freq: Every day | ORAL | Status: DC
  Administered 2021-06-27 – 2021-07-05 (×8): 75 ug via ORAL
  Filled 2021-06-26 (×9): qty 1

## 2021-06-26 MED ORDER — METHOCARBAMOL 500 MG PO TABS
500.0000 mg | ORAL_TABLET | Freq: Three times a day (TID) | ORAL | Status: DC
  Administered 2021-06-26 – 2021-07-05 (×27): 500 mg via ORAL
  Filled 2021-06-26 (×27): qty 1

## 2021-06-26 MED ORDER — MELATONIN 3 MG PO TABS
3.0000 mg | ORAL_TABLET | Freq: Every day | ORAL | Status: DC
  Administered 2021-06-26 – 2021-07-03 (×8): 3 mg via ORAL
  Filled 2021-06-26 (×8): qty 1

## 2021-06-26 MED ORDER — ATORVASTATIN CALCIUM 10 MG PO TABS
20.0000 mg | ORAL_TABLET | Freq: Every day | ORAL | Status: DC
  Administered 2021-06-27 – 2021-07-05 (×9): 20 mg via ORAL
  Filled 2021-06-26 (×9): qty 2

## 2021-06-26 MED ORDER — TROLAMINE SALICYLATE 10 % EX CREA: TOPICAL_CREAM | Freq: Two times a day (BID) | CUTANEOUS | Status: DC | PRN

## 2021-06-26 MED ORDER — VANCOMYCIN 50 MG/ML ORAL SOLUTION: 125.0000 mg | Freq: Every day | ORAL | Status: DC

## 2021-06-26 MED ORDER — OXYCODONE HCL 5 MG PO TABS
5.0000 mg | ORAL_TABLET | ORAL | Status: DC | PRN
Start: 2021-06-26 — End: 2021-07-05
  Administered 2021-06-28 – 2021-07-03 (×3): 5 mg via ORAL
  Filled 2021-06-26 (×4): qty 1

## 2021-06-26 MED ORDER — VANCOMYCIN 50 MG/ML ORAL SOLUTION
125.0000 mg | Freq: Two times a day (BID) | ORAL | Status: DC
  Administered 2021-06-29 – 2021-07-05 (×12): 125 mg via ORAL
  Filled 2021-06-26 (×13): qty 2.5

## 2021-06-26 MED ORDER — ACETAMINOPHEN 325 MG PO TABS
325.0000 mg | ORAL_TABLET | ORAL | Status: DC | PRN
Start: 2021-06-26 — End: 2021-07-05
  Administered 2021-07-03: 325 mg via ORAL
  Filled 2021-06-26: qty 1

## 2021-06-26 MED ORDER — VANCOMYCIN 50 MG/ML ORAL SOLUTION
125.0000 mg | Freq: Four times a day (QID) | ORAL | Status: AC
  Administered 2021-06-26 – 2021-06-29 (×14): 125 mg via ORAL
  Filled 2021-06-26 (×14): qty 2.5

## 2021-06-26 MED ORDER — TRAMADOL HCL 50 MG PO TABS
100.0000 mg | ORAL_TABLET | Freq: Two times a day (BID) | ORAL | Status: DC
  Administered 2021-06-26 – 2021-07-05 (×18): 100 mg via ORAL
  Filled 2021-06-26 (×18): qty 2

## 2021-06-26 MED ORDER — SERTRALINE HCL 50 MG PO TABS
50.0000 mg | ORAL_TABLET | Freq: Every day | ORAL | Status: DC
  Administered 2021-06-27 – 2021-07-05 (×9): 50 mg via ORAL
  Filled 2021-06-26 (×9): qty 1

## 2021-06-26 MED ORDER — PROPRANOLOL HCL 20 MG PO TABS
40.0000 mg | ORAL_TABLET | Freq: Three times a day (TID) | ORAL | Status: DC
  Administered 2021-06-26 – 2021-07-05 (×27): 40 mg via ORAL
  Filled 2021-06-26 (×27): qty 2

## 2021-06-26 MED ORDER — CHOLESTYRAMINE 4 G PO PACK
4.0000 g | PACK | Freq: Three times a day (TID) | ORAL | Status: DC
  Administered 2021-06-26 – 2021-06-27 (×3): 4 g via ORAL
  Filled 2021-06-26 (×4): qty 1

## 2021-06-26 MED ORDER — QUETIAPINE FUMARATE 25 MG PO TABS
25.0000 mg | ORAL_TABLET | Freq: Two times a day (BID) | ORAL | Status: DC
  Administered 2021-06-26 – 2021-07-01 (×10): 25 mg via ORAL
  Filled 2021-06-26 (×10): qty 1

## 2021-06-26 MED ORDER — CLONIDINE HCL 0.1 MG PO TABS
0.1000 mg | ORAL_TABLET | Freq: Three times a day (TID) | ORAL | Status: DC
  Administered 2021-06-26 – 2021-07-05 (×27): 0.1 mg via ORAL
  Filled 2021-06-26 (×27): qty 1

## 2021-06-26 MED ORDER — APIXABAN 5 MG PO TABS
5.0000 mg | ORAL_TABLET | Freq: Two times a day (BID) | ORAL | Status: DC
  Administered 2021-06-26 – 2021-07-05 (×18): 5 mg via ORAL
  Filled 2021-06-26 (×18): qty 1

## 2021-06-26 MED ORDER — FAMOTIDINE 20 MG PO TABS
20.0000 mg | ORAL_TABLET | Freq: Two times a day (BID) | ORAL | Status: DC
  Administered 2021-06-26 – 2021-07-05 (×18): 20 mg via ORAL
  Filled 2021-06-26 (×18): qty 1
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
## 2021-06-26 NOTE — Progress Notes (Signed)
Occupational Therapy Session Note  Patient Details  Name: John Bryan MRN: [id number] Date of Birth: Sep 02, 1951  Today's Date: 06/26/2021 OT Individual Time: 3567-0141 OT Individual Time Calculation (min): 60 min    Short Term Goals: Week 2:  OT Short Term Goal 1 (Week 2): Pt will complete transfer to [facility] with mod A OT Short Term Goal 1 - Progress (Week 2): Not met OT Short Term Goal 2 (Week 2): Pt will demonstrate improved activity tolerance by participating in >120 min of therapy/day OT Short Term Goal 2 - Progress (Week 2): Not met OT Short Term Goal 3 (Week 2): Pt will complete UB ADLs with set up assist only EOB OT Short Term Goal 3 - Progress (Week 2): Not met  Skilled Therapeutic Interventions/Progress Updates:    Patient in bed, alert and cooperative.  Supine to sitting edge of bed with CS.  Sit to stand and ambulation with RW to/from bed, toilet and w/c with CG/min A.  Toileting completed with CGA for pants down, mod A hygiene after BM, mod A pants up.  Able to donn incontinence brief and shorts seated on commode with mod A.  UB dressing and grooming tasks completed w/c level at sink with set up/CS and increased time.  Ambulation with RW back to bed with CGA.  Sit to supine min A.  He remained in bed at close of session, bed alarm set and call bell in reach.    Therapy Documentation Precautions:  Precautions Precautions: Fall Precaution Comments: G-tube, fall, L 5th digit WBAT, sinus precautions Restrictions Weight Bearing Restrictions: No LUE Weight Bearing: Weight bearing as tolerated   Therapy/Group: Individual Therapy  Carlos Levering 06/26/2021, 7:25 AM

## 2021-06-26 NOTE — Progress Notes (Signed)
Physical Therapy TBI Note  Patient Details  Name: John Bryan MRN: [id number] Date of Birth: [birth year]  Today's Date: 06/26/2021 PT Individual Time: 1515-1555 PT Individual Time Calculation (min): 40 min   Short Term Goals: Week 3:  PT Short Term Goal 1 (Week 3): Patient will perform bed mobility with CGA consistently with use of hospital bed features. PT Short Term Goal 2 (Week 3): Patient will perform basic transfers with min A >50% of the time using LRAD. PT Short Term Goal 3 (Week 3): Patient will ambulate 50 ft with CGA using RW. PT Short Term Goal 4 (Week 3): Patient will initiate stair training.  Skilled Therapeutic Interventions/Progress Updates:     Patient in bed with his girlfriend in the room upon PT arrival. Patient alert and agreeable to PT session. Patient denied pain during session.  Patient reports that the room "looks like a saloon" but he knows it is a hospital room. He states that he sometimes sees different places or objects around him, mostly in the mornings when first waking up. Educated patient on common symptoms of hallucinations, vivid dreaming, and distortions of reality with fatigue following TBI.   Patient incontinent of urine and agreeable to ambulating to the bathroom to get cleaned up.  Therapeutic Activity: Bed Mobility: Patient performed supine to/from sit with supervision-CGA with heavy use of bed rail. Provided verbal cues for bringing opposite shoulder forward and bringing knees to chest to bring legs off/on the bed. Transfers: Patient performed sit to/from stand from elevated bed, BSC over the toilet x2, and w/c with min A using RW. Provided verbal cues for forward weight shift and hip/knee/trunk extension to come to standing. Patient was incontinent and continent of bladder and continent of bowl during toileting. Required total A for peri-care in standing and for doffing/donning incontinence brief and pants. Required sitting rest break between  peri-care and lower body dressing due to decreased standing tolerance. Provided cues for rocking and relaxation during toileing to promote BM and reduce straining. He performed hand hygiene independently in standing at the sink with CGA for balance.  Gait Training:  Patient ambulated 14 feet and 7 feet x2 using bariatric RW with CGA. Ambulated with decreased gait speed, decreased step length and height, increased B hip and knee flexion in stance, forward trunk lean, and downward head gaze. Provided verbal cues for erect posture, looking ahead, and increased step length.  Patient in bed with his girlfriend and RN in the room at end of session with breaks locked, bed alarm set, and all needs within reach.   Therapy Documentation Precautions:  Precautions Precautions: Fall Precaution Comments: G-tube, fall, L 5th digit WBAT, sinus precautions Restrictions Weight Bearing Restrictions: No LUE Weight Bearing: Weight bearing as tolerated Agitated Behavior Scale: TBI Observation Details Observation Environment: pt room Start of observation period - Date: 06/26/21 Start of observation period - Time: 1515 End of observation period - Date: 06/26/21 End of observation period - Time: 1555 Agitated Behavior Scale (DO NOT LEAVE BLANKS) Short attention span, easy distractibility, inability to concentrate: Absent Impulsive, impatient, low tolerance for pain or frustration: Present to a slight degree Uncooperative, resistant to care, demanding: Absent Violent and/or threatening violence toward people or property: Absent Explosive and/or unpredictable anger: Absent Rocking, rubbing, moaning, or other self-stimulating behavior: Absent Pulling at tubes, restraints, etc.: Absent Wandering from treatment areas: Absent Restlessness, pacing, excessive movement: Absent Repetitive behaviors, motor, and/or verbal: Present to a slight degree Rapid, loud, or excessive talking: Absent Sudden changes  of mood:  Present to a slight degree Easily initiated or excessive crying and/or laughter: Absent Self-abusiveness, physical and/or verbal: Absent Agitated behavior scale total score: 17     Therapy/Group: Individual Therapy  Lilienne Weins L Bubber Rothert PT, DPT  06/26/2021, 4:38 PM

## 2021-06-26 NOTE — Progress Notes (Signed)
Speech Language Pathology Daily Session Note  Patient Details  Name: John Bryan MRN: [id number] Date of Birth: 1951-02-06  Today's Date: 06/26/2021 SLP Individual Time: 1115-1200 SLP Individual Time Calculation (min): 45 min  Short Term Goals: Week 4: SLP Short Term Goal 1 (Week 4): Patient will demonstrate efficient mastication and complete oral clearance with trials of Dys. 3 textures with minimal overt s/s of aspiration over 2 sessions prior to upgrade. SLP Short Term Goal 2 (Week 4): Patient will consume current diet with minimal overt s/s of aspiration with Mod I for use of swallowing compensatory strategies. SLP Short Term Goal 3 (Week 4): Patient will demonstrate sustained attention to functional tasks fo 30 minutes with Min verbal cues for redirection. SLP Short Term Goal 4 (Week 4): Patient will orient to time, place, situation using external aides with supervision verbal and visual cues. SLP Short Term Goal 5 (Week 4): Patient will demonstrate awareness to errors when completing basic level problem solving tasks with supevision verbal cues.  Skilled Therapeutic Interventions:  Pt was seen for skilled ST targeting cognitive goals.  Upon arrival, pt stated that his incontinence brief was wet after 2-3 voids and that he needed to be cleaned.  When SLP asked why he hadn't called the nursing station to be cleaned after the first void, pt stated "I usually wait until I've gone 2-3 times before calling because I go so frequently."  SLP reiterated the importance of timely hygiene to prevent skin breakdown and assisted pt in getting cleaned up and donning clean brief and pants.  During conversations with therapist, pt was able to recall at least 3 specific activities from AM OT session with supervision question cues.  While therapist was in the room, pt also asked for assistance appropriately to use the urinal and was able to avoid an additional incontinence episode.  Pt was left in bed with  bed alarm set and call bell within reach.  Continue per current plan of care.    Pain  No/denies pain  Therapy/Group: Individual Therapy  Christpher Stogsdill, Melanee Spry 06/26/2021, 3:43 PM

## 2021-06-27 DIAGNOSIS — R1312 Dysphagia, oropharyngeal phase: Secondary | ICD-10-CM | POA: Diagnosis not present

## 2021-06-27 DIAGNOSIS — A0472 Enterocolitis due to Clostridium difficile, not specified as recurrent: Secondary | ICD-10-CM | POA: Diagnosis not present

## 2021-06-27 DIAGNOSIS — E119 Type 2 diabetes mellitus without complications: Secondary | ICD-10-CM | POA: Diagnosis not present

## 2021-06-27 DIAGNOSIS — S069X3S Unspecified intracranial injury with loss of consciousness of 1 hour to 5 hours 59 minutes, sequela: Secondary | ICD-10-CM | POA: Diagnosis not present

## 2021-06-27 LAB — GLUCOSE, CAPILLARY
Glucose-Capillary: 76 mg/dL (ref 70–99)
Glucose-Capillary: 112 mg/dL — ABNORMAL HIGH (ref 70–99)
Glucose-Capillary: 116 mg/dL — ABNORMAL HIGH (ref 70–99)
Glucose-Capillary: 79 mg/dL (ref 70–99)

## 2021-06-27 LAB — CBC
HCT: 39.8 % (ref 39.0–52.0)
Hemoglobin: 12.8 g/dL — ABNORMAL LOW (ref 13.0–17.0)
MCH: 29.6 pg (ref 26.0–34.0)
MCHC: 32.2 g/dL (ref 30.0–36.0)
MCV: 91.9 fL (ref 80.0–100.0)
Platelets: 266 10*3/uL (ref 150–400)
RBC: 4.33 MIL/uL (ref 4.22–5.81)
RDW: 14.7 % (ref 11.5–15.5)
WBC: 7 10*3/uL (ref 4.0–10.5)
nRBC: 0 % (ref 0.0–0.2)

## 2021-06-27 LAB — BASIC METABOLIC PANEL
Anion gap: 6 (ref 5–15)
BUN: 11 mg/dL (ref 8–23)
CO2: 24 mmol/L (ref 22–32)
Calcium: 8.6 mg/dL — ABNORMAL LOW (ref 8.9–10.3)
Chloride: 104 mmol/L (ref 98–111)
Creatinine, Ser: 0.92 mg/dL (ref 0.61–1.24)
GFR, Estimated: >60 mL/min (ref >60)
Glucose, Bld: 85 mg/dL (ref 70–99)
Potassium: 4 mmol/L (ref 3.5–5.1)
Sodium: 134 mmol/L — ABNORMAL LOW (ref 135–145)

## 2021-06-27 MED ORDER — INSULIN ASPART PROT & ASPART (70-30 MIX) 100 UNIT/ML ~~LOC~~ SUSP
36.0000 [IU] | Freq: Two times a day (BID) | SUBCUTANEOUS | Status: DC
  Administered 2021-06-27 – 2021-07-01 (×9): 36 [IU] via SUBCUTANEOUS
  Filled 2021-06-27 (×2): qty 10

## 2021-06-27 MED ORDER — CHOLESTYRAMINE 4 G PO PACK
4.0000 g | PACK | Freq: Two times a day (BID) | ORAL | Status: DC
  Administered 2021-06-27 – 2021-07-05 (×16): 4 g via ORAL
  Filled 2021-06-27 (×17): qty 1
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
## 2021-06-27 NOTE — Progress Notes (Signed)
Physical Therapy TBI Note  Patient Details  Name: John Bryan MRN: [id number] Date of Birth: Jan 20, 1951  Today's Date: 06/27/2021 PT Individual Time: 1100-1148 PT Individual Time Calculation (min): 48 min   Short Term Goals: Week 1:  PT Short Term Goal 1 (Week 1): Pt will transfer with mod assist  and LRAD to WC PT Short Term Goal 1 - Progress (Week 1): Progressing toward goal PT Short Term Goal 2 (Week 1): Pt will tolerate sitting in WC>2 hours between therapies PT Short Term Goal 2 - Progress (Week 1): Progressing toward goal PT Short Term Goal 3 (Week 1): Pt will ambulate 59f with mod assist and LRAD PT Short Term Goal 3 - Progress (Week 1): Progressing toward goal Week 2:  PT Short Term Goal 1 (Week 2): Pt will transfer with mod assist  and LRAD to WC PT Short Term Goal 1 - Progress (Week 2): Met PT Short Term Goal 2 (Week 2): Pt will tolerate sitting in WC 1 hour between therapies PT Short Term Goal 2 - Progress (Week 2): Progressing toward goal PT Short Term Goal 3 (Week 2): Pt will ambulate 298fwith mod assist and LRAD PT Short Term Goal 3 - Progress (Week 2): Met Week 3:  PT Short Term Goal 1 (Week 3): Patient will perform bed mobility with CGA consistently with use of hospital bed features. PT Short Term Goal 2 (Week 3): Patient will perform basic transfers with min A >50% of the time using LRAD. PT Short Term Goal 3 (Week 3): Patient will ambulate 50 ft with CGA using RW. PT Short Term Goal 4 (Week 3): Patient will initiate stair training. Week 4:     Skilled Therapeutic Interventions/Progress Updates:    Pt initially supine, awake, agreeable to session.  Pt lifts feet for therapist to thread pants then raises pants over thighs and hips w/supervision, additional time, rolls to raise. Supine to side to sit w/supervision, additional time, use of bedrail. Pt maintains sitting balance w/supervision while therapist [facility]ns [facility] gri[facility] socks. Sit to stand from  elevated bed w/cga to Rw.   Gait [id number] wc placed in hall including turning/backing to wc and sitting w/cga only.  Improved safety awareness demonstrated today.   Pt transported to gym. Sit to stand at stairwell from wc w/cga. 3 inch Stairs w/2 rails: Asecended step to then descended via backing x 2 steps repeated 2x w/cga. Pt rested in sitting several min for needed recovery. Pt then ascended/descended 3steps as above before returning to sitting due to fatigue. Wc propulsion x [id number] straight path. Gait 45ft20fin hallway to bed w/RW, cga including turning and sitting to edge of bed.   Pt then requested use of urinal.  Total assist for clothing management but able to position urinal himself today.  Pt handed off to NT as session timed out.  Therapy Documentation Precautions:  Precautions Precautions: Fall Precaution Comments: G-tube, fall, L 5th digit WBAT, sinus precautions Restrictions Weight Bearing Restrictions: No LUE Weight Bearing: Weight bearing as tolerated  Pain: pt reports 3/10 back pain, care taken with all mobility w/no increase in pain during session.   Agitated Behavior Scale: TBI Observation Details Observation Environment: Patient's room (Simultaneous filing. User may not have seen previous data.) Start of observation period - Date: 06/27/21 (Simultaneous filing. User may not have seen previous data.) Start of observation period - Time: 0900 (Simultaneous filing. User may not have seen previous data.) End of observation period -  Date: 06/27/21 (Simultaneous filing. User may not have seen previous data.) End of observation period - Time: 0930 (Simultaneous filing. User may not have seen previous data.) Agitated Behavior Scale (DO NOT LEAVE BLANKS) Short attention span, easy distractibility, inability to concentrate: Present to a slight degree (Simultaneous filing. User may not have seen previous data.) Impulsive, impatient, low tolerance for pain or  frustration: Absent (Simultaneous filing. User may not have seen previous data.) Uncooperative, resistant to care, demanding: Absent (Simultaneous filing. User may not have seen previous data.) Violent and/or threatening violence toward people or property: Absent (Simultaneous filing. User may not have seen previous data.) Explosive and/or unpredictable anger: Absent (Simultaneous filing. User may not have seen previous data.) Rocking, rubbing, moaning, or other self-stimulating behavior: Absent (Simultaneous filing. User may not have seen previous data.) Pulling at tubes, restraints, etc.: Absent (Simultaneous filing. User may not have seen previous data.) Wandering from treatment areas: Absent (Simultaneous filing. User may not have seen previous data.) Restlessness, pacing, excessive movement: Absent (Simultaneous filing. User may not have seen previous data.) Repetitive behaviors, motor, and/or verbal: Absent (Simultaneous filing. User may not have seen previous data.) Rapid, loud, or excessive talking: Absent (Simultaneous filing. User may not have seen previous data.) Sudden changes of mood: Absent (Simultaneous filing. User may not have seen previous data.) Easily initiated or excessive crying and/or laughter: Absent (Simultaneous filing. User may not have seen previous data.) Self-abusiveness, physical and/or verbal: Absent (Simultaneous filing. User may not have seen previous data.) Agitated behavior scale total score: 15 (Simultaneous filing. User may not have seen previous data.)      Therapy/Group: Individual Therapy Callie Fielding, [place] 06/27/2021, 12:43 PM

## 2021-06-27 NOTE — Progress Notes (Signed)
Occupational Therapy Session Note  Patient Details  Name: John Bryan MRN: [id number] Date of Birth: 14-Oct-1951  Today's Date: 06/27/2021 OT Individual Time: 1400-1445 OT Individual Time Calculation (min): 45 min    Short Term Goals: Week 3:  OT Short Term Goal 1 (Week 3): Pt will demo improved activity tolerance by participating in 60 min of therapy/day OT Short Term Goal 2 (Week 3): Pt will complete stand pivot transfer to the toilet with mod A OT Short Term Goal 3 (Week 3): Pt will demo improved self regulation skills by not becoming agitated with therapy staff 50% of sessions  Skilled Therapeutic Interventions/Progress Updates:    Patient in bed, alert and ready for therapy session.  He notes need to void.  Supine to sitting edge of bed with CS and increased time.  Sit to stand from edge of bed with CGA - able to maintain standing to urinate in urinal with assist to hold urinal in place.  He is able to perform anterior and posterior hygiene with CGA in stance.  Min A to donn incontinence brief and shorts.  Doff/donn [place] shirt with set up.  Sit to stand and short distance ambulation with RW bed to w/c with CGA.  He completed oral care with set up.   Completed monocular acuity, ROM and tracking activities - both eyes with impaired acuity, right - able to read 1" letters at 18 & 24", not clear at 48", left - able to read 1" letters at 12" with lateral placement.  Right eye ROM impaired to right, left eye ROM impaired to end range left side.  Able to track with right eye within available range, left tracking at slow rate unable to see full image.  Binocular vision: able to see single image in lower left quadrant.  Reviewed appropriate use of patch and challenge with both eyes.   Hand off to OT, [name], for remainder of session.    Therapy Documentation Precautions:  Precautions Precautions: Fall Precaution Comments: G-tube, fall, L 5th digit WBAT, sinus precautions Restrictions Weight  Bearing Restrictions: No LUE Weight Bearing: Weight bearing as tolerated   Therapy/Group: Individual Therapy  [name] 06/27/2021, 7:40 AM

## 2021-06-27 NOTE — Plan of Care (Signed)
  Problem: Consults Goal: RH BRAIN INJURY PATIENT EDUCATION Description: Description: See Patient Education module for eduction specifics Outcome: Progressing Goal: Skin Care Protocol Initiated - if Braden Score 18 or less Description: If consults are not indicated, leave blank or document N/A Outcome: Progressing Goal: Nutrition Consult-if indicated Outcome: Progressing Goal: Diabetes Guidelines if Diabetic/Glucose > 140 Description: If diabetic or lab glucose is > 140 mg/dl - Initiate Diabetes/Hyperglycemia Guidelines & Document Interventions  Outcome: Progressing   Problem: RH BOWEL ELIMINATION Goal: RH STG MANAGE BOWEL WITH ASSISTANCE Description: STG Manage Bowel with Supervision Assistance. Outcome: Progressing   Problem: RH BLADDER ELIMINATION Goal: RH STG MANAGE BLADDER WITH ASSISTANCE Description: STG Manage Bladder With Supervision Assistance Outcome: Progressing   Problem: RH SKIN INTEGRITY Goal: RH STG MAINTAIN SKIN INTEGRITY WITH ASSISTANCE Description: STG Maintain Skin Integrity With Supervision Assistance. Outcome: Progressing Goal: RH STG ABLE TO PERFORM INCISION/WOUND CARE W/ASSISTANCE Description: STG Able To Perform Incision/Wound Care With Supervision Assistance. Outcome: Progressing   Problem: RH SAFETY Goal: RH STG ADHERE TO SAFETY PRECAUTIONS W/ASSISTANCE/DEVICE Description: STG Adhere to Safety Precautions With Supervision Assistance/Device. Outcome: Progressing Goal: RH STG DECREASED RISK OF FALL WITH ASSISTANCE Description: STG Decreased Risk of Fall With Cues and Reminders. Outcome: Progressing   Problem: RH COGNITION-NURSING Goal: RH STG USES MEMORY AIDS/STRATEGIES W/ASSIST TO PROBLEM SOLVE Description: STG Uses Memory Aids/Strategies With Cues and Reminders to Problem Solve. Outcome: Progressing Goal: RH STG ANTICIPATES NEEDS/CALLS FOR ASSIST W/ASSIST/CUES Description: STG Anticipates Needs/Calls for Assist With Supervision  Assistance/Cues. Outcome: Progressing   Problem: RH PAIN MANAGEMENT Goal: RH STG PAIN MANAGED AT OR BELOW PT'S PAIN GOAL Description: <4 on 0-10 pain scale. Outcome: Progressing   Problem: RH KNOWLEDGE DEFICIT BRAIN INJURY Goal: RH STG INCREASE KNOWLEDGE OF SELF CARE AFTER BRAIN INJURY Description: Patient will be able to demonstrate knowledge of medication management, pain management, skin/wound care, and weight bearing precautions with educational materials and handouts provided by staff independently at discharge. Outcome: Progressing   Problem: RH Vision Goal: RH LTG Vision (Specify) Outcome: Progressing   

## 2021-06-27 NOTE — Progress Notes (Signed)
Occupational Therapy Weekly Progress Note  Patient Details  Name: John Bryan MRN: [id number] Date of Birth: 1951/05/29  Beginning of progress report period: June 20, 2021 End of progress report period: June 27, 2021  Today's Date: 06/27/2021 OT Individual Time: 1445-1500 OT Individual Time Calculation (min): 15 min    Patient has met 3 of 3 short term goals.  Pt has made great progress this week toward his OT goals. His motivation and his previously incontinent urgent BM's have improved significantly. He is now able to complete transfers at a CGA-min A level, as well as ADLs.   Patient continues to demonstrate the following deficits: muscle weakness, decreased cardiorespiratoy endurance, decreased visual acuity, decreased visual perceptual skills, decreased visual motor skills, and field cut, decreased initiation, decreased attention, decreased awareness, decreased problem solving, decreased safety awareness, decreased memory, and delayed processing, and decreased standing balance, decreased postural control, and decreased balance strategies and therefore will continue to benefit from skilled OT intervention to enhance overall performance with BADL.  Patient progressing toward long term goals..  Continue plan of care.  OT Short Term Goals Week 3:  OT Short Term Goal 1 (Week 3): Pt will demo improved activity tolerance by participating in 60 min of therapy/day OT Short Term Goal 1 - Progress (Week 3): Met OT Short Term Goal 2 (Week 3): Pt will complete stand pivot transfer to the toilet with mod A OT Short Term Goal 2 - Progress (Week 3): Met OT Short Term Goal 3 (Week 3): Pt will demo improved self regulation skills by not becoming agitated with therapy staff 50% of sessions OT Short Term Goal 3 - Progress (Week 3): Met Week 4:  OT Short Term Goal 1 (Week 4): STG= LTG d/t ELOS  Skilled Therapeutic Interventions/Progress Updates:    Pt received in w/c with OT [facility] performing  visual assessment/treatment. Direct handoff to this OT. Pt requesting to return to supine in bed. Pt completed sit > stand from the w/c with CGA. He used RW to complete functional mobility back to his bed, with CGA overall. He required min A to stand from EOB after seated rest break. He required min A to remove shorts. Pt was left supine with all needs met, bed alarm set.   Therapy Documentation Precautions:  Precautions Precautions: Fall Precaution Comments: G-tube, fall, L 5th digit WBAT, sinus precautions Restrictions Weight Bearing Restrictions: No LUE Weight Bearing: Weight bearing as tolerated    Therapy/Group: Individual Therapy  Curtis Sites 06/27/2021, 6:06 AM

## 2021-06-27 NOTE — Progress Notes (Signed)
Speech Language Pathology TBI Note  Patient Details  Name: John Bryan MRN: [id number] Date of Birth: 09/22/1951  Today's Date: 06/27/2021 SLP Individual Time: 0900-0930 SLP Individual Time Calculation (min): 30 min  Short Term Goals: Week 4: SLP Short Term Goal 1 (Week 4): Patient will demonstrate efficient mastication and complete oral clearance with trials of Dys. 3 textures with minimal overt s/s of aspiration over 2 sessions prior to upgrade. SLP Short Term Goal 2 (Week 4): Patient will consume current diet with minimal overt s/s of aspiration with Mod I for use of swallowing compensatory strategies. SLP Short Term Goal 3 (Week 4): Patient will demonstrate sustained attention to functional tasks fo 30 minutes with Min verbal cues for redirection. SLP Short Term Goal 4 (Week 4): Patient will orient to time, place, situation using external aides with supervision verbal and visual cues. SLP Short Term Goal 5 (Week 4): Patient will demonstrate awareness to errors when completing basic level problem solving tasks with supevision verbal cues.  Skilled Therapeutic Interventions: Skilled treatment session focused on cognitive goals. SLP facilitated session by attempting to utilize his memory notebook now that his "backup" pair of glasses were present. Despite having glasses, patient unable to read large print. SLP attempted to have the patient write the information but due to visual deficits, the information was small and ran together which made it difficult to read. Patient also continues to demonstrate severe issues with hearing which impacts overall communication. SLP will continue to work on strategies to maximize recall with patient's current hearing and visual deficits. Patient left upright in bed with alarm on and all needs within reach. Continue with current plan of care.      Pain No/Denies Pain   Agitated Behavior Scale: TBI Observation Details Observation Environment: Patient's  room (Simultaneous filing. User may not have seen previous data.) Start of observation period - Date: 06/27/21 (Simultaneous filing. User may not have seen previous data.) Start of observation period - Time: 0900 (Simultaneous filing. User may not have seen previous data.) End of observation period - Date: 06/27/21 (Simultaneous filing. User may not have seen previous data.) End of observation period - Time: 0930 (Simultaneous filing. User may not have seen previous data.) Agitated Behavior Scale (DO NOT LEAVE BLANKS) Short attention span, easy distractibility, inability to concentrate: Present to a slight degree (Simultaneous filing. User may not have seen previous data.) Impulsive, impatient, low tolerance for pain or frustration: Absent (Simultaneous filing. User may not have seen previous data.) Uncooperative, resistant to care, demanding: Absent (Simultaneous filing. User may not have seen previous data.) Violent and/or threatening violence toward people or property: Absent (Simultaneous filing. User may not have seen previous data.) Explosive and/or unpredictable anger: Absent (Simultaneous filing. User may not have seen previous data.) Rocking, rubbing, moaning, or other self-stimulating behavior: Absent (Simultaneous filing. User may not have seen previous data.) Pulling at tubes, restraints, etc.: Absent (Simultaneous filing. User may not have seen previous data.) Wandering from treatment areas: Absent (Simultaneous filing. User may not have seen previous data.) Restlessness, pacing, excessive movement: Absent (Simultaneous filing. User may not have seen previous data.) Repetitive behaviors, motor, and/or verbal: Absent (Simultaneous filing. User may not have seen previous data.) Rapid, loud, or excessive talking: Absent (Simultaneous filing. User may not have seen previous data.) Sudden changes of mood: Absent (Simultaneous filing. User may not have seen previous data.) Easily initiated  or excessive crying and/or laughter: Absent (Simultaneous filing. User may not have seen previous data.) Self-abusiveness, physical and/or  verbal: Absent (Simultaneous filing. User may not have seen previous data.) Agitated behavior scale total score: 15 (Simultaneous filing. User may not have seen previous data.)  Therapy/Group: Individual Therapy  Saim Almanza 06/27/2021, 12:41 PM

## 2021-06-28 DIAGNOSIS — S069X3S Unspecified intracranial injury with loss of consciousness of 1 hour to 5 hours 59 minutes, sequela: Secondary | ICD-10-CM | POA: Diagnosis not present

## 2021-06-28 DIAGNOSIS — R1312 Dysphagia, oropharyngeal phase: Secondary | ICD-10-CM | POA: Diagnosis not present

## 2021-06-28 DIAGNOSIS — E119 Type 2 diabetes mellitus without complications: Secondary | ICD-10-CM | POA: Diagnosis not present

## 2021-06-28 DIAGNOSIS — A0472 Enterocolitis due to Clostridium difficile, not specified as recurrent: Secondary | ICD-10-CM | POA: Diagnosis not present

## 2021-06-28 LAB — GLUCOSE, CAPILLARY
Glucose-Capillary: 84 mg/dL (ref 70–99)
Glucose-Capillary: 178 mg/dL — ABNORMAL HIGH (ref 70–99)
Glucose-Capillary: 141 mg/dL — ABNORMAL HIGH (ref 70–99)
Glucose-Capillary: 152 mg/dL — ABNORMAL HIGH (ref 70–99)

## 2021-06-28 MED ORDER — FREE WATER
100.0000 mL | Freq: Four times a day (QID) | Status: DC
  Administered 2021-06-28 – 2021-07-05 (×27): 100 mL

## 2021-06-28 MED ORDER — CARBAMIDE PEROXIDE 6.5 % OT SOLN
5.0000 [drp] | Freq: Two times a day (BID) | OTIC | Status: AC
  Administered 2021-06-28 – 2021-07-02 (×10): 5 [drp] via OTIC
  Filled 2021-06-28: qty 15

## 2021-06-28 NOTE — Patient Care Conference (Signed)
Inpatient RehabilitationTeam Conference and Plan of Care Update Date: 06/28/2021   Time: 10:15 AM    Patient Name: John Bryan      Medical Record Number: [id number]  Date of Birth: 1951/07/20 Sex: Male         Room/Bed: 4W09C/4W09C-01 Payor Info: Payor: MEDICARE / Plan: MEDICARE PART A AND B / Product Type: *No Product type* /    Admit Date/Time:  06/03/2021  2:34 PM  Primary Diagnosis:  TBI (traumatic brain injury) [facility])  Hospital Problems: Principal Problem:   TBI (traumatic brain injury) (HCC) Active Problems:   C. difficile diarrhea    Expected Discharge Date: Expected Discharge Date: 07/05/21  Team Members Present: Physician leading conference: Dr. Faith Rogue Social Worker Present: Cecile Sheerer, LCSWA Nurse Present: Kennyth Arnold, RN PT Present: Serina Cowper, PT OT Present: Jake Shark, OT SLP Present: Feliberto Gottron, SLP PPS Coordinator present : Fae Pippin, SLP     Current Status/Progress Goal Weekly Team Focus  Bowel/Bladder   Pt incontinent of B/B. LBM 06/27/2021  Regular BMs every 3 days or less  Assess B/B every shift and PRN   Swallow/Nutrition/ Hydration   Dys. 2 textures with thin liquids, supervision-Mod I  Mod I  tolerance of current diet, increase use of swallow strategies   ADL's   Great improvement, min A overall with ADLs, CGA-min A with transfers. Still with confusion at times  CGA- supervision  cognitive retraining, ADLs, generalized activity tolerance, visual perception   Mobility   Min A-CGA overall, gait consistently ~25 ft with RW, 3x3" steps CGA with B HRs, w/c propulsion 25 ft  Min A-CGA overall, 4 STE without rails  Activity tolerance, visual retraining PRN, functional mobility, gait and stair training, patient/caregiver education   Communication             Safety/Cognition/ Behavioral Observations  Mod A  supervision  sustained attention, orientation, awareness and problem solving   Pain   Pt denies pain  Pain  scale of <3/10  Assess pain every shift and PRN   Skin   Skin intact  no new breakdown  Assess skin every shift and PRN     Discharge Planning:  D/c to home with s/o. Pt dtr and her husband moving here to help assist as reported s/o not able to handle pt. Children have completed a partial move, and will be flying out and returning prior to pt d/c. Fam edu completed with dtr, son in law, and s/o on 7/22.   Team Discussion: C-diff doing better, will treat longer. Topamax for headaches. Eating well. Incontinent B/B, will sometimes use urinal. No complaints of pain or sleep issues. Bottom is red. Family education completed on Friday.  Patient on target to meet rehab goals: yes, good improvement. Contact guard upper body, min assist lower body. Intermittent confusion. Progress has been made. Walking up to 25 ft. Started stair training yesterday. Vestibular eval today. Hearing aides not working, can't see that well. Not much progress with SLP. Would like to have 1 more family education session.  *See Care Plan and progress notes for long and short-term goals.   Revisions to Treatment Plan:  Continue C-diff treatment.  Teaching Needs: Family education, medication management, pain management, skin/wound care, transfer training, gait training, stair training, balance training, endurance training, safety awareness.  Current Barriers to Discharge: Decreased caregiver support, Medical stability, Home enviroment access/layout, Incontinence, Wound care, Lack of/limited family support, Weight, Medication compliance, and Behavior  Possible Resolutions to Barriers: Continue current  medications, provide emotional support.     Medical Summary Current Status: improving stools, extended C-diff rx, headaches and back pain more manageable. sugars under better control  Barriers to Discharge: Medical stability   Possible Resolutions to Barriers/Weekly Focus: rx of c diff, adjustments to pain regimen, daily  assessment of labs and pt data   Continued Need for Acute Rehabilitation Level of Care: The patient requires daily medical management by a physician with specialized training in physical medicine and rehabilitation for the following reasons: Direction of a multidisciplinary physical rehabilitation program to maximize functional independence : Yes Medical management of patient stability for increased activity during participation in an intensive rehabilitation regime.: Yes Analysis of laboratory values and/or radiology reports with any subsequent need for medication adjustment and/or medical intervention. : Yes   I attest that I was present, lead the team conference, and concur with the assessment and plan of the team.   Tennis Must 06/28/2021, 2:52 PM
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
## 2021-06-28 NOTE — Plan of Care (Signed)
  Problem: RH Memory Goal: LTG Patient will demonstrate ability for day to day (SLP) Description: LTG:   Patient will demonstrate ability for day to day recall/carryover during cognitive/linguistic activities with assist  (SLP) Flowsheets (Taken 06/28/2021 0651) LTG: Patient will demonstrate ability for day to day recall/carryover during cognitive/linguistic activities with assist (SLP): Minimal Assistance - Patient > 75% Note: Goal downgraded due to slow progress

## 2021-06-28 NOTE — Progress Notes (Signed)
Occupational Therapy TBI Note  Patient Details  Name: John Bryan MRN: [id number] Date of Birth: Oct 27, 1951  Today's Date: 06/28/2021 OT Individual Time: 5859-2924 OT Individual Time Calculation (min): 45 min    Short Term Goals: Week 3:  OT Short Term Goal 1 (Week 3): Pt will demo improved activity tolerance by participating in 60 min of therapy/day OT Short Term Goal 1 - Progress (Week 3): Met OT Short Term Goal 2 (Week 3): Pt will complete stand pivot transfer to the toilet with mod A OT Short Term Goal 2 - Progress (Week 3): Met OT Short Term Goal 3 (Week 3): Pt will demo improved self regulation skills by not becoming agitated with therapy staff 50% of sessions OT Short Term Goal 3 - Progress (Week 3): Met  Skilled Therapeutic Interventions/Progress Updates:    Pt supine with no c/o pain, agreeable to try shower this session. Pt required CGA to transition from supine to sitting EOB. Min A sit >stand from EOB with RW. Pt completed ambulatory transfer into the bathroom with CGA with RW. Pt completed standing level urination with extra time for void and CGA, mod cueing for sequencing. Pt transferred into shower with min A. Pt required min A for LB bathing, distal feet and thoroughness of his bottom in standing. Pt required mod A to stand from shower chair following shower d/t fatigue. RN alerted to PEG site bleeding. Pt completed UB dressing with supervision. Min A for LB dressing. Reported dizziness when he tipped his head back but reported it alleviated with rest. He returned to EOB and direct pass off to SLP in room.   Therapy Documentation Precautions:  Precautions Precautions: Fall Precaution Comments: G-tube, fall, L 5th digit WBAT, sinus precautions Restrictions Weight Bearing Restrictions: No LUE Weight Bearing: Weight bearing as tolerated Agitated Behavior Scale: TBI Observation Details Observation Environment: pt room Start of observation period - Date:  06/28/21 Start of observation period - Time: 0830 End of observation period - Date: 06/28/21 End of observation period - Time: 0915 Agitated Behavior Scale (DO NOT LEAVE BLANKS) Short attention span, easy distractibility, inability to concentrate: Absent Impulsive, impatient, low tolerance for pain or frustration: Absent Uncooperative, resistant to care, demanding: Absent Violent and/or threatening violence toward people or property: Absent Explosive and/or unpredictable anger: Absent Rocking, rubbing, moaning, or other self-stimulating behavior: Absent Pulling at tubes, restraints, etc.: Absent Wandering from treatment areas: Absent Restlessness, pacing, excessive movement: Absent Repetitive behaviors, motor, and/or verbal: Absent Rapid, loud, or excessive talking: Absent Sudden changes of mood: Absent Easily initiated or excessive crying and/or laughter: Absent Self-abusiveness, physical and/or verbal: Absent Agitated behavior scale total score: 14     Therapy/Group: Individual Therapy  Curtis Sites 06/28/2021, 10:19 AM

## 2021-06-28 NOTE — Progress Notes (Signed)
Speech Language Pathology TBI Note  Patient Details  Name: John Bryan MRN: [id number] Date of Birth: Mar 01, 1951  Today's Date: 06/28/2021 SLP Individual Time: 0915-0930 SLP Individual Time Calculation (min): 15 min  Short Term Goals: Week 4: SLP Short Term Goal 1 (Week 4): Patient will demonstrate efficient mastication and complete oral clearance with trials of Dys. 3 textures with minimal overt s/s of aspiration over 2 sessions prior to upgrade. SLP Short Term Goal 2 (Week 4): Patient will consume current diet with minimal overt s/s of aspiration with Mod I for use of swallowing compensatory strategies. SLP Short Term Goal 3 (Week 4): Patient will demonstrate sustained attention to functional tasks fo 30 minutes with Min verbal cues for redirection. SLP Short Term Goal 4 (Week 4): Patient will orient to time, place, situation using external aides with supervision verbal and visual cues. SLP Short Term Goal 5 (Week 4): Patient will demonstrate awareness to errors when completing basic level problem solving tasks with supevision verbal cues.  Skilled Therapeutic Interventions: Skilled treatment session focused on cognitive goals. Patient received from OT siting EOB and reporting dizziness. Patient reports he suspects due to putting his head back while drying it. Patient sat EOB and eventually reported that the dizziness had subsided. Patient consumed thin liquids via straw without overt s/s of aspiration but did have excessive belching, RN aware. Patient also consumed medications whole in puree without overt s/s of aspiration but educated RN on crushing medications due to possible narrowing of his distal esophagus per barium swallow. She verbalized understanding. Patient left supine in bed with alarm on and all needs within reach. Continue with current plan of care.      Pain Pain Assessment Pain Scale: 0-10 Pain Score: 0-No pain  Agitated Behavior Scale: TBI Observation  Details Observation Environment: Patient's room Start of observation period - Date: 06/28/21 Start of observation period - Time: 0915 End of observation period - Date: 06/28/21 End of observation period - Time: 0930 Agitated Behavior Scale (DO NOT LEAVE BLANKS) Short attention span, easy distractibility, inability to concentrate: Absent Impulsive, impatient, low tolerance for pain or frustration: Absent Uncooperative, resistant to care, demanding: Absent Violent and/or threatening violence toward people or property: Absent Explosive and/or unpredictable anger: Absent Rocking, rubbing, moaning, or other self-stimulating behavior: Absent Pulling at tubes, restraints, etc.: Absent Wandering from treatment areas: Absent Restlessness, pacing, excessive movement: Absent Repetitive behaviors, motor, and/or verbal: Absent Rapid, loud, or excessive talking: Absent Sudden changes of mood: Absent Easily initiated or excessive crying and/or laughter: Absent Self-abusiveness, physical and/or verbal: Absent Agitated behavior scale total score: 14  Therapy/Group: Individual Therapy  Lillis Nuttle 06/28/2021, 12:31 PM
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
## 2021-06-28 NOTE — Plan of Care (Signed)
  Problem: Consults Goal: RH BRAIN INJURY PATIENT EDUCATION Description: Description: See Patient Education module for eduction specifics Outcome: Progressing Goal: Skin Care Protocol Initiated - if Braden Score 18 or less Description: If consults are not indicated, leave blank or document N/A Outcome: Progressing Goal: Nutrition Consult-if indicated Outcome: Progressing Goal: Diabetes Guidelines if Diabetic/Glucose > 140 Description: If diabetic or lab glucose is > 140 mg/dl - Initiate Diabetes/Hyperglycemia Guidelines & Document Interventions  Outcome: Progressing   Problem: RH BOWEL ELIMINATION Goal: RH STG MANAGE BOWEL WITH ASSISTANCE Description: STG Manage Bowel with Supervision Assistance. Outcome: Progressing   Problem: RH BLADDER ELIMINATION Goal: RH STG MANAGE BLADDER WITH ASSISTANCE Description: STG Manage Bladder With Supervision Assistance Outcome: Progressing   Problem: RH SKIN INTEGRITY Goal: RH STG MAINTAIN SKIN INTEGRITY WITH ASSISTANCE Description: STG Maintain Skin Integrity With Supervision Assistance. Outcome: Progressing Goal: RH STG ABLE TO PERFORM INCISION/WOUND CARE W/ASSISTANCE Description: STG Able To Perform Incision/Wound Care With Supervision Assistance. Outcome: Progressing   Problem: RH SAFETY Goal: RH STG ADHERE TO SAFETY PRECAUTIONS W/ASSISTANCE/DEVICE Description: STG Adhere to Safety Precautions With Supervision Assistance/Device. Outcome: Progressing Goal: RH STG DECREASED RISK OF FALL WITH ASSISTANCE Description: STG Decreased Risk of Fall With Cues and Reminders. Outcome: Progressing   Problem: RH COGNITION-NURSING Goal: RH STG USES MEMORY AIDS/STRATEGIES W/ASSIST TO PROBLEM SOLVE Description: STG Uses Memory Aids/Strategies With Cues and Reminders to Problem Solve. Outcome: Progressing Goal: RH STG ANTICIPATES NEEDS/CALLS FOR ASSIST W/ASSIST/CUES Description: STG Anticipates Needs/Calls for Assist With Supervision  Assistance/Cues. Outcome: Progressing   Problem: RH PAIN MANAGEMENT Goal: RH STG PAIN MANAGED AT OR BELOW PT'S PAIN GOAL Description: <4 on 0-10 pain scale. Outcome: Progressing   Problem: RH KNOWLEDGE DEFICIT BRAIN INJURY Goal: RH STG INCREASE KNOWLEDGE OF SELF CARE AFTER BRAIN INJURY Description: Patient will be able to demonstrate knowledge of medication management, pain management, skin/wound care, and weight bearing precautions with educational materials and handouts provided by staff independently at discharge. Outcome: Progressing   Problem: RH Vision Goal: RH LTG Vision (Specify) Outcome: Progressing   

## 2021-06-28 NOTE — Progress Notes (Signed)
Patient ID: John Bryan, male   DOB: 1951/06/16, 70 y.o.   MRN: [id number]  SW spoke with pt dtr [name] to provide updates from team conference, and d/c date remains 8/2. SW discussed [facility] preference. Reports will review and send preference. Dtr asked about pt getting hospital bed since she does not think he can get out of a regular bed, and also [facility]. SW informed will f/u with medical team and will provide updates. States they are currently in [place] but should be  back on Saturday or Sunday. Fam edu scheduled for Monday (8/1) 1pm-3pm.  Cecile Sheerer, MSW, LCSWA Office: [phone] Cell: [phone] Fax: [phone]

## 2021-06-28 NOTE — Progress Notes (Addendum)
Occupational Therapy Session Note  Patient Details  Name: John Bryan MRN: [id number] Date of Birth: Aug 22, 1951  Today's Date: 06/28/2021 OT Individual Time: 1115-1155 OT Individual Time Calculation (min): 40 min    Short Term Goals: Week 4:  OT Short Term Goal 1 (Week 4): STG= LTG d/t ELOS  Skilled Therapeutic Interventions/Progress Updates:    Pt supine in bed, reporting he needs to pee. Also complains that after bending his head back this morning to wash his hair his "equilibrium was short circuited".   Pt attempting to reach left bed rail with right hand however undershooting multiple times and needing hand over hand to successfully grasp.  Pt stating "my depth perception is off". Min assist supine to sit EOB.  Min assist sit to stand at [facility] then CGA to ambulate to bathroom.  Pt stood to urinate with CGA and min assist for clothing management due to brief falling to floor and unable to bend forward all the way to reach.  Pt ambulated using RW to the sink and required min Vcs and visual cues to orient to location of soap dispenser.  Pt washed hands with close supervision.  Ambulated back to EOB with CGA.  Min assist sit to supine to support BLE. Patient requesting left eye patch at end of session and required min assist to donn.  Call bell in reach, bed alarm on at end of session.  Therapy Documentation Precautions:  Precautions Precautions: Fall Precaution Comments: G-tube, fall, L 5th digit WBAT, sinus precautions Restrictions Weight Bearing Restrictions: No LUE Weight Bearing: Weight bearing as tolerated    Therapy/Group: Individual Therapy  Amie Critchley 06/28/2021, 4:36 PM

## 2021-06-29 LAB — GLUCOSE, CAPILLARY
Glucose-Capillary: 146 mg/dL — ABNORMAL HIGH (ref 70–99)
Glucose-Capillary: 178 mg/dL — ABNORMAL HIGH (ref 70–99)
Glucose-Capillary: 96 mg/dL (ref 70–99)
Glucose-Capillary: 94 mg/dL (ref 70–99)

## 2021-06-29 NOTE — Progress Notes (Signed)
Physical Therapy Note  Patient Details  Name: John Bryan MRN: [id number] Date of Birth: [birth year] Today's Date: 06/29/2021    Patient in bed asleep upon PT arrival for vestibular treatment following all other therapies for improved tolerance. Patient reported fatigue and declined treatment at this time. Patient missed 30 min of skilled PT due to fatigue, RN made aware. Will attempt to make-up missed time as able.       Namrata Dangler L Cleave Ternes PT, DPT  06/29/2021, 4:15 PM

## 2021-06-29 NOTE — Progress Notes (Signed)
Speech Language Pathology TBI Note  Patient Details  Name: John Bryan MRN: [id number] Date of Birth: June 20, 1951  Today's Date: 06/29/2021 SLP Individual Time: 1435-1500 SLP Individual Time Calculation (min): 25 min  Short Term Goals: Week 4: SLP Short Term Goal 1 (Week 4): Patient will demonstrate efficient mastication and complete oral clearance with trials of Dys. 3 textures with minimal overt s/s of aspiration over 2 sessions prior to upgrade. SLP Short Term Goal 2 (Week 4): Patient will consume current diet with minimal overt s/s of aspiration with Mod I for use of swallowing compensatory strategies. SLP Short Term Goal 3 (Week 4): Patient will demonstrate sustained attention to functional tasks fo 30 minutes with Min verbal cues for redirection. SLP Short Term Goal 4 (Week 4): Patient will orient to time, place, situation using external aides with supervision verbal and visual cues. SLP Short Term Goal 5 (Week 4): Patient will demonstrate awareness to errors when completing basic level problem solving tasks with supevision verbal cues.  Skilled Therapeutic Interventions: Skilled treatment session focused on dysphagia goals. Upon arrival, patient's significant other present and asking questions related to patient's current cognitive functioning. SLP provided education regarding strategies to utilize at home to maximize recall and carryover of basic, daily information. She verbalized understanding. SLP also facilitated session by providing trials of Dys. 3 textures. Patient demonstrated efficient mastication with complete oral clearance without overt s/s of aspiration. Recommend trial tray prior to upgrade. Patient left upright in bed with alarm on and all needs within reach. Continue with current plan of care.      Pain Pain in lower back when sitting upright, patient repositioned   Agitated Behavior Scale: TBI Observation Details Observation Environment: Patient's room Start of  observation period - Date: 06/29/21 Start of observation period - Time: 1435 End of observation period - Date: 06/29/21 End of observation period - Time: 1500 Agitated Behavior Scale (DO NOT LEAVE BLANKS) Short attention span, easy distractibility, inability to concentrate: Absent Impulsive, impatient, low tolerance for pain or frustration: Absent Uncooperative, resistant to care, demanding: Absent Violent and/or threatening violence toward people or property: Absent Explosive and/or unpredictable anger: Absent Rocking, rubbing, moaning, or other self-stimulating behavior: Absent Pulling at tubes, restraints, etc.: Absent Wandering from treatment areas: Absent Restlessness, pacing, excessive movement: Absent Repetitive behaviors, motor, and/or verbal: Absent Rapid, loud, or excessive talking: Absent Sudden changes of mood: Absent Easily initiated or excessive crying and/or laughter: Absent Self-abusiveness, physical and/or verbal: Absent Agitated behavior scale total score: 14  Therapy/Group: Individual Therapy  [name] 06/29/2021, 3:13 PM

## 2021-06-29 NOTE — Progress Notes (Signed)
Physical Therapy TBI Note  Patient Details  Name: John Bryan MRN: [id number] Date of Birth: Sep 25, 1951  Today's Date: 06/29/2021 PT Individual Time: 1405-1520 PT Individual Time Calculation (min): 75 min   Short Term Goals: Week 1:  PT Short Term Goal 1 (Week 1): Pt will transfer with mod assist  and LRAD to WC PT Short Term Goal 1 - Progress (Week 1): Progressing toward goal PT Short Term Goal 2 (Week 1): Pt will tolerate sitting in WC>2 hours between therapies PT Short Term Goal 2 - Progress (Week 1): Progressing toward goal PT Short Term Goal 3 (Week 1): Pt will ambulate 35f with mod assist and LRAD PT Short Term Goal 3 - Progress (Week 1): Progressing toward goal Week 2:  PT Short Term Goal 1 (Week 2): Pt will transfer with mod assist  and LRAD to WC PT Short Term Goal 1 - Progress (Week 2): Met PT Short Term Goal 2 (Week 2): Pt will tolerate sitting in WC 1 hour between therapies PT Short Term Goal 2 - Progress (Week 2): Progressing toward goal PT Short Term Goal 3 (Week 2): Pt will ambulate 261fwith mod assist and LRAD PT Short Term Goal 3 - Progress (Week 2): Met Week 3:  PT Short Term Goal 1 (Week 3): Patient will perform bed mobility with CGA consistently with use of hospital bed features. PT Short Term Goal 1 - Progress (Week 3): Progressing toward goal PT Short Term Goal 2 (Week 3): Patient will perform basic transfers with min A >50% of the time using LRAD. PT Short Term Goal 2 - Progress (Week 3): Met PT Short Term Goal 3 (Week 3): Patient will ambulate 50 ft with CGA using RW. PT Short Term Goal 3 - Progress (Week 3): Met PT Short Term Goal 4 (Week 3): Patient will initiate stair training. PT Short Term Goal 4 - Progress (Week 3): Met Week 4:  PT Short Term Goal 1 (Week 4): STG=LTG due to ELOS.  Skilled Therapeutic Interventions/Progress Updates:    Pt initially c/o "a little" back pain, increased to "a lot "of back pain following stair training and commode  transfer.  Requested muscle relaxant, nursing notified.  Declined heat/cryotherapy.    Pt initially supine/agreeable to session w/focus on functional mobility/stair training.  Therapist donned Teds and gripper socks for time management.  Supine to sit w/cga and use of bedrail, additional time.  Sit to stand w/cues for anterior wt shift to RW w/min to cga.  stand pivot transfer w/RW w/cga and cues for safety.   Discussed w/OT suggestions for visual issues w/stair training.  Decided best option to retape lens temporarily to decrease risk of falls that may result from depth perception/double-multivision/dizzyness on stairs.   Pt transported to gym.   Stair training: Sit to stand from wc w/cga to stairs.  Ascended/descended 8 3in stairs w/2 rails including turning at landing w/min to cga, additional time, cues for sequencing.   Sit to stand from wc w/cga to 6in stairs and ascended/descended 2 6in stairs w/2 rails w/min assist, backed down stairs for safety w/min assist.  Pt able to power up today which is significant functional gain.   Pt transported to hallway near room.  Gait 2552fo commode in room and commode transfer w/rw w/cga to min assist.  Total assist for clothing management.  Pt incontinent/ in brief/continent of urine on commode.  Wife present and educated of need for incontinence briefs at home due to consistent issues in  IPR.  Educated where to purchase and sizing.  Wife observed best process for assisting pt w/doffing brief/pants donning to hips in sitting/completed in standing w/pt using RW for stabilization.  Gait 32f to bed w/cues for posture, staying safe distance within walker, turning fully w/transfer to edge of bed, cga. Pt assisted to supine w/mod assist due to back pain complaints.  Wife w/many questions/all answered by this therapist regarding equipment recs, delivery of equipment, insurance coverage, progress, stairs, what to do in event of fall/educated pt no yet ready for floor  transfer training and would need to contact emergency services/fire dept if this occurred.  Also, therapist again requested wife provide pictures and measurements of home/stairs/steps/doorways to allow for proper training and equipment ordering.  Wife agreed to follow up w/these requests.   At end of session, Pt left supine w/rails up x 4, alarm set, bed in lowest position, and needs in reach.   Therapy Documentation Precautions:  Precautions Precautions: Fall Precaution Comments: G-tube, fall, L 5th digit WBAT, sinus precautions Restrictions Weight Bearing Restrictions: No LUE Weight Bearing: Weight bearing as tolerated Pain:   Agitated Behavior Scale: TBI Observation Details Observation Environment: room/gym Start of observation period - Date: 06/29/21 Start of observation period - Time: 1305 End of observation period - Date: 06/29/21 End of observation period - Time: 1420 Agitated Behavior Scale (DO NOT LEAVE BLANKS) Short attention span, easy distractibility, inability to concentrate: Absent Impulsive, impatient, low tolerance for pain or frustration: Absent Uncooperative, resistant to care, demanding: Absent Violent and/or threatening violence toward people or property: Absent Explosive and/or unpredictable anger: Absent Rocking, rubbing, moaning, or other self-stimulating behavior: Absent Pulling at tubes, restraints, etc.: Absent Wandering from treatment areas: Absent Restlessness, pacing, excessive movement: Absent Repetitive behaviors, motor, and/or verbal: Absent Rapid, loud, or excessive talking: Absent Sudden changes of mood: Absent Easily initiated or excessive crying and/or laughter: Absent Self-abusiveness, physical and/or verbal: Absent Agitated behavior scale total score: 14      Therapy/Group: Individual Therapy BCallie Fielding [facility]7/27/2022, 2:49 PM

## 2021-06-29 NOTE — Progress Notes (Signed)
Occupational Therapy TBI Note  Patient Details  Name: John Bryan MRN: [id number] Date of Birth: 1951-11-09  Today's Date: 06/29/2021 OT Individual Time: 2353-6144 OT Individual Time Calculation (min): 42 min    Short Term Goals: Week 1:  OT Short Term Goal 1 (Week 1): Pt will tolerate EOB ADLs with no supine rest breaks to increase functional activity tolerance OT Short Term Goal 1 - Progress (Week 1): Met OT Short Term Goal 2 (Week 1): Pt will don pants with max A OT Short Term Goal 2 - Progress (Week 1): Progressing toward goal OT Short Term Goal 3 (Week 1): Pt will complete sit > stand for toileting tasks with max A OT Short Term Goal 3 - Progress (Week 1): Met OT Short Term Goal 4 (Week 1): Pt will demonstrate improved intellectual awareness of deficits with mod cueing OT Short Term Goal 4 - Progress (Week 1): Met  Skilled Therapeutic Interventions/Progress Updates:     Pt received in bed with "some" pain in back Repositioned for pain relief  ADL:  Pt completes UB dressing with set up seated in w/c Pt completes LB dressing with MOD A for threading RLE second and CGA for sit to stand at sink Pt completes toileting with CGA for balance during 3/3 components Pt completes toileting transfer with with RW at [facility] level with MIN A and VC for crossing elevated threshold into bathroom Pt grooms at sink seated in w/c with set up   Pt left at end of session in bed with exit alarm on, call light in reach and all needs met   Therapy Documentation Precautions:  Precautions Precautions: Fall Precaution Comments: G-tube, fall, L 5th digit WBAT, sinus precautions Restrictions Weight Bearing Restrictions: No LUE Weight Bearing: Weight bearing as tolerated General:   Vital Signs: Therapy Vitals Temp: 98.3 F (36.8 C) Pulse Rate: (!) 58 Resp: 16 BP: 137/72 Patient Position (if appropriate): Lying Oxygen Therapy SpO2: 97 % O2 Device: Room Air Pain:   Agitated  Behavior Scale: TBI  Observation Details Observation Environment: pt room Start of observation period - Date: 06/29/21 Start of observation period - Time: 0945 End of observation period - Date: 06/29/21 End of observation period - Time: 1030 Agitated Behavior Scale (DO NOT LEAVE BLANKS) Short attention span, easy distractibility, inability to concentrate: Absent Impulsive, impatient, low tolerance for pain or frustration: Absent Uncooperative, resistant to care, demanding: Absent Violent and/or threatening violence toward people or property: Absent Explosive and/or unpredictable anger: Absent Rocking, rubbing, moaning, or other self-stimulating behavior: Absent Pulling at tubes, restraints, etc.: Absent Wandering from treatment areas: Absent Restlessness, pacing, excessive movement: Absent Repetitive behaviors, motor, and/or verbal: Absent Rapid, loud, or excessive talking: Absent Sudden changes of mood: Absent Easily initiated or excessive crying and/or laughter: Absent Self-abusiveness, physical and/or verbal: Absent Agitated behavior scale total score: 14  ADL: ADL Eating: NPO Grooming: Contact guard Where Assessed-Grooming: Edge of bed Upper Body Bathing: Minimal assistance Where Assessed-Upper Body Bathing: Edge of bed Lower Body Bathing: Maximal assistance Where Assessed-Lower Body Bathing: Bed level Upper Body Dressing: Minimal assistance Where Assessed-Upper Body Dressing: Edge of bed Lower Body Dressing: Dependent Where Assessed-Lower Body Dressing: Bed level Toileting: Dependent Where Assessed-Toileting: Bed level Toilet Transfer: Unable to assess Tub/Shower Transfer: Unable to assess Vision   Perception    Praxis   Exercises:   Other Treatments:     Therapy/Group: Individual Therapy  Tonny Branch 06/29/2021, 6:46 AM

## 2021-06-30 DIAGNOSIS — A0472 Enterocolitis due to Clostridium difficile, not specified as recurrent: Secondary | ICD-10-CM | POA: Diagnosis not present

## 2021-06-30 DIAGNOSIS — E119 Type 2 diabetes mellitus without complications: Secondary | ICD-10-CM | POA: Diagnosis not present

## 2021-06-30 DIAGNOSIS — R1312 Dysphagia, oropharyngeal phase: Secondary | ICD-10-CM | POA: Diagnosis not present

## 2021-06-30 DIAGNOSIS — S069X3S Unspecified intracranial injury with loss of consciousness of 1 hour to 5 hours 59 minutes, sequela: Secondary | ICD-10-CM | POA: Diagnosis not present

## 2021-06-30 LAB — GLUCOSE, CAPILLARY
Glucose-Capillary: 101 mg/dL — ABNORMAL HIGH (ref 70–99)
Glucose-Capillary: 111 mg/dL — ABNORMAL HIGH (ref 70–99)
Glucose-Capillary: 68 mg/dL — ABNORMAL LOW (ref 70–99)
Glucose-Capillary: 69 mg/dL — ABNORMAL LOW (ref 70–99)
Glucose-Capillary: 74 mg/dL (ref 70–99)
Glucose-Capillary: 120 mg/dL — ABNORMAL HIGH (ref 70–99)

## 2021-06-30 NOTE — Progress Notes (Addendum)
Hypoglycemic Event  CBG: 69  Treatment: 4 oz juice/soda  Symptoms: None  Follow-up CBG: Time:1712 CBG Result:68  Possible Reasons for Event: Inadequate meal intake  Comments/MD notified Second Hypoglycemic protocol initiated. Pt eating dinner.    Cylus Douville S

## 2021-06-30 NOTE — Progress Notes (Signed)
Pt c/o nausea this am. Declined Zofran. Meds given via peg tube per pt request. Pt declined breakfast

## 2021-06-30 NOTE — Progress Notes (Signed)
Patient ID: John Bryan, male   DOB: February 29, 1951, 70 y.o.   MRN: [id number]  Therapy reported pt did not need hospital bed and pt can tolerate a standard cushion in the wheelchair.   SW spoke with [name] [phone] to inform on above. She will f/u with SW to discuss [facility] preference. She will f/u with SW.  *[facility] preference is [facility]. SW will follow-up.  HHPT/OT/SLP/SN/aide referral accepted by [facility].   SW called pt dtr [name] to inform on above.   Cecile Sheerer, MSW, LCSWA Office: [phone] Cell: [phone] Fax: [phone]
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
## 2021-06-30 NOTE — Progress Notes (Signed)
Patient refused CBG this morning as well as medication and wound dressing x3. Pt stated "leave me alone" multiple times as well as swatted at nursing staff. No other concerns to report.

## 2021-06-30 NOTE — Progress Notes (Signed)
Pt blood sugar 69 at 1642, hypoglycemic protocol started

## 2021-06-30 NOTE — Progress Notes (Signed)
Hypoglycemic Event  CBG: 68  Treatment: 4 oz juice/soda Patient eating dinner   Symptoms: None  Follow-up CBG: Time:1733 CBG Result:74  Possible Reasons for Event: Inadequate meal intake  Comments/MD notified:None     John Bryan S

## 2021-06-30 NOTE — Progress Notes (Signed)
Speech Language Pathology Weekly Progress and Session Note  Patient Details  Name: John Bryan MRN: [id number] Date of Birth: 1951/08/01  Beginning of progress report period: June 23, 2021 End of progress report period: June 30, 2021  Today's Date: 06/30/2021 SLP Individual Time: 1130-1230 SLP Individual Time Calculation (min): 60 min  Short Term Goals: Week 4: SLP Short Term Goal 1 (Week 4): Patient will demonstrate efficient mastication and complete oral clearance with trials of Dys. 3 textures with minimal overt s/s of aspiration over 2 sessions prior to upgrade. SLP Short Term Goal 1 (Week 4) - Progress (Week 4): Met SLP Short Term Goal 2 (Week 4): Patient will consume current diet with minimal overt s/s of aspiration with Mod I for use of swallowing compensatory strategies. SLP Short Term Goal 2 - Progress (Week 4): Met SLP Short Term Goal 3 (Week 4): Patient will demonstrate sustained attention to functional tasks fo 30 minutes with Min verbal cues for redirection. SLP Short Term Goal 3 - Progress (Week 4): Met SLP Short Term Goal 4 (Week 4): Patient will orient to time, place, situation using external aides with supervision verbal and visual cues. SLP Short Term Goal 4 - Progress (Week 4): Met SLP Short Term Goal 5 (Week 4): Patient will demonstrate awareness to errors when completing basic level problem solving tasks with supevision verbal cues. SLP Short Term Goal 5 - Progress (Week 4): Not met    New Short Term Goals: Week 5: SLP Short Term Goal 1 (Week 5): STGs=LTGs due to ELOS  Weekly Progress Updates: Patient continues to make functional gains and has met 4 of 5 STGs this reporting period. Currently, patient is consuming Dys. ____ textures with thin liquids with minimal overt s/s of aspiration and intermittent supervision level verbal cues needed for use of swallowing compensatory strategies, mostly for proper positioning. Patient demonstrates improved participation and  attention to functional tasks but continues to demonstrate intermittent poor safety awareness and confusion. Suspect function is also impacted by visual and hearing deficits. Patient and family education ongoing. Patient would benefit continued skilled SLP intervention to maximize his swallowing and cognitive functioning prior to discharge.      Intensity: Minumum of 1-2 x/day, 30 to 90 minutes Frequency: 3 to 5 out of 7 days Duration/Length of Stay: 07/05/21 Treatment/Interventions: Cognitive remediation/compensation;Dysphagia/aspiration precaution training;Internal/external aids;Cueing hierarchy;Functional tasks;Patient/family education;Therapeutic Activities;Environmental controls   Daily Session  Skilled Therapeutic Interventions:  Skilled treatment session focused on cognitive and dysphagia goals. SLP facilitated session by providing extra time and Min A for patient to transfer to the wheelchair via a stand pivot with the RW. Patient sat up in the wheelchair for the entire 60 minutes of the session but required increased assistance with standing from the wheelchair, patient reports due to feeling "stiff" from sitting in place too long. Patient also requested to use the urinal and was continent of bladder. Patient with increased social engagement and overall verbal expression today with mild confusion noted regarding placement of room. Patient consumed his lunch trial tray of Dys. 3 textures with thin liquids without overt s/s of aspiration with only mildly prolonged mastication needed. Recommend patient upgrade to Dys. 3 textures. Patient transferred back to bed at end of session and left with alarm on and all needs within reach. Continue with current plan of care.    Pain Pain Assessment Pain Scale: 0-10 Pain Score: 0-No pain  Therapy/Group: Individual Therapy  Renatha Rosen 06/30/2021, 1:42 PM

## 2021-06-30 NOTE — Progress Notes (Signed)
Physical Therapy TBI Note  Patient Details  Name: John Bryan MRN: [id number] Date of Birth: 1951-04-21  Today's Date: 06/30/2021 PT Individual Time: 0901-1001 PT Individual Time Calculation (min): 60 min   Short Term Goals: Week 1:  PT Short Term Goal 1 (Week 1): Pt will transfer with mod assist  and LRAD to WC PT Short Term Goal 1 - Progress (Week 1): Progressing toward goal PT Short Term Goal 2 (Week 1): Pt will tolerate sitting in WC>2 hours between therapies PT Short Term Goal 2 - Progress (Week 1): Progressing toward goal PT Short Term Goal 3 (Week 1): Pt will ambulate 47f with mod assist and LRAD PT Short Term Goal 3 - Progress (Week 1): Progressing toward goal Week 2:  PT Short Term Goal 1 (Week 2): Pt will transfer with mod assist  and LRAD to WC PT Short Term Goal 1 - Progress (Week 2): Met PT Short Term Goal 2 (Week 2): Pt will tolerate sitting in WC 1 hour between therapies PT Short Term Goal 2 - Progress (Week 2): Progressing toward goal PT Short Term Goal 3 (Week 2): Pt will ambulate 267fwith mod assist and LRAD PT Short Term Goal 3 - Progress (Week 2): Met Week 3:  PT Short Term Goal 1 (Week 3): Patient will perform bed mobility with CGA consistently with use of hospital bed features. PT Short Term Goal 1 - Progress (Week 3): Progressing toward goal PT Short Term Goal 2 (Week 3): Patient will perform basic transfers with min A >50% of the time using LRAD. PT Short Term Goal 2 - Progress (Week 3): Met PT Short Term Goal 3 (Week 3): Patient will ambulate 50 ft with CGA using RW. PT Short Term Goal 3 - Progress (Week 3): Met PT Short Term Goal 4 (Week 3): Patient will initiate stair training. PT Short Term Goal 4 - Progress (Week 3): Met Week 4:  PT Short Term Goal 1 (Week 4): STG=LTG due to ELOS.  Skilled Therapeutic Interventions/Progress Updates:   Pt very confused but pleasantly so this am.   Asks how they put up a restaurant out side his room so quickly, why  they changed his room, tells me he showered upstairs this morning.  Reoriented mult times, pt accepts that he is confused.   Therapist donns teds and gripper socs.  Supine to sit w/rail, additional time, cga.  Sit to stand w/cga to RW, slowly achieves upright posture.  Gait [id number] therapist noted bowel incontinence, rerouted to bathroom, commode transfer w/cga and cues for safety.  Pt obviously incontinent of bowels.  Total assist for pericare which he stands w/Rw for therapist to perform.  Total assist for brief change and lower body dressing in sitting/standing.  Gait [id number] wc w RW, transfers to wc w/cues, cga  Pt transproted to gym for time efficiency. Stairs:  pt able to ascend/turn at landing/descend 4  6in stairs w/2 rails w/cga of 2 for safety (descending first time).  Step to gait.  Encourage using RLE as "good leg" but does alternate successfully at times.   Gait: 241ft2fto bed w/RW, cga.  Turn/sit to bed w/cues for sequencing, cga.  Sit to supine w/min assist for LE management.  No c/o back pain this session.  Pt left supine w/rails up x 3, alarm set, bed in lowest position, and needs in reach.  Therapy Documentation Precautions:  Precautions Precautions: Fall Precaution Comments: G-tube, fall, L 5th digit WBAT, sinus precautions Restrictions  Weight Bearing Restrictions: No LUE Weight Bearing: Weight bearing as tolerated Pain Assessment Pain Scale: 0-10 Pain Score: 0-No pain Agitated Behavior Scale: TBI          Therapy/Group: Individual Therapy Callie Fielding, [place] 06/30/2021, 12:53 PM

## 2021-06-30 NOTE — Progress Notes (Signed)
Physical Therapy TBI Note  Patient Details  Name: John Bryan MRN: [id number] Date of Birth: 1951-07-14  Today's Date: 06/30/2021 PT Individual Time: 1540-1600 PT Individual Time Calculation (min): 20 min   Short Term Goals: Week 4:  PT Short Term Goal 1 (Week 4): STG=LTG due to ELOS.  Skilled Therapeutic Interventions/Progress Updates:     Patient in bed asleep upon PT arrival to make up missed minutes and perform re-assessment for BPPV. Patient easily aroused and agreeable to vestibular assessment/treatment. Patient reported 3-4/10 back pain during session, RN made aware. PT provided repositioning, rest breaks, and distraction as pain interventions throughout session.   Patient performed supine to sit with CGA with use of bed rail and min cues for log roll technique. Performed B side-lying test with bed in trendelenburg to compensate for decreased cervical extension. Patient without symptoms or nystagmus present bilaterally. Patient denied symptoms during therapy since Epley maneuver performed on 7/26. Will progress patient with visual accomodation a vestibular hypofunction interventions as appropriate during PT sessions.   Patient in side-lying in bed at end of session with breaks locks, bed alarm set, and all needs in reach.    Therapy Documentation Precautions:  Precautions Precautions: Fall Precaution Comments: G-tube, fall, L 5th digit WBAT, sinus precautions Restrictions Weight Bearing Restrictions: No LUE Weight Bearing: Weight bearing as tolerated  Agitated Behavior Scale: TBI Observation Details Observation Environment: Patient's room Start of observation period - Date: 06/30/21 Start of observation period - Time: 1130 End of observation period - Date: 06/30/21 End of observation period - Time: 1250 Agitated Behavior Scale (DO NOT LEAVE BLANKS) Short attention span, easy distractibility, inability to concentrate: Absent Impulsive, impatient, low tolerance for  pain or frustration: Absent Uncooperative, resistant to care, demanding: Absent Violent and/or threatening violence toward people or property: Absent Explosive and/or unpredictable anger: Absent Rocking, rubbing, moaning, or other self-stimulating behavior: Absent Pulling at tubes, restraints, etc.: Absent Wandering from treatment areas: Absent Restlessness, pacing, excessive movement: Absent Repetitive behaviors, motor, and/or verbal: Absent Rapid, loud, or excessive talking: Absent Sudden changes of mood: Absent Easily initiated or excessive crying and/or laughter: Absent Self-abusiveness, physical and/or verbal: Absent Agitated behavior scale total score: 14    Therapy/Group: Individual Therapy  Trystan Eads L [name] PT, DPT  06/30/2021, 4:05 PM

## 2021-06-30 NOTE — Progress Notes (Signed)
Occupational Therapy TBI Note  Patient Details  Name: John Bryan MRN: [id number] Date of Birth: 01-23-1951  Today's Date: 06/30/2021 OT Individual Time: 1330-1415 OT Individual Time Calculation (min): 45 min    Short Term Goals: Week 1:  OT Short Term Goal 1 (Week 1): Pt will tolerate EOB ADLs with no supine rest breaks to increase functional activity tolerance OT Short Term Goal 1 - Progress (Week 1): Met OT Short Term Goal 2 (Week 1): Pt will don pants with max A OT Short Term Goal 2 - Progress (Week 1): Progressing toward goal OT Short Term Goal 3 (Week 1): Pt will complete sit > stand for toileting tasks with max A OT Short Term Goal 3 - Progress (Week 1): Met OT Short Term Goal 4 (Week 1): Pt will demonstrate improved intellectual awareness of deficits with mod cueing OT Short Term Goal 4 - Progress (Week 1): Met  Skilled Therapeutic Interventions/Progress Updates:    Pt received in bed asleep with pain reported with mobiltiy/fatigue. Hot shower used to calm pain. Pt completes ambulatory transfer with MIN A to power up to stand with RW for transfer into bathroom at ambulatory level with VC for steering RW and proximity to RW. Pt bathes with A to wash BLE as unable ot cross to figure 4 from height of BSC in shower. Pt requires MIN A to stand to wash buttocks. Pt dons shirt with set up and incontinence brief with MIN A. Pt stands to use urinal with steadying A. MOD A Sit>sup with bed rail. Exited session with pt seated in bed, exit alarm on and call light in reach   Therapy Documentation Precautions:  Precautions Precautions: Fall Precaution Comments: G-tube, fall, L 5th digit WBAT, sinus precautions Restrictions Weight Bearing Restrictions: No LUE Weight Bearing: Weight bearing as tolerated General:   Vital Signs: Therapy Vitals Temp: 98.9 F (37.2 C) Pulse Rate: 60 Resp: 15 BP: 106/60 Patient Position (if appropriate): Lying Oxygen Therapy SpO2: 97 % O2 Device:  Room Air Pain:   Agitated Behavior Scale: TBI  Observation Details Observation Environment: pt room Start of observation period - Date: 06/30/21 Start of observation period - Time: 1330 End of observation period - Date: 06/30/21 End of observation period - Time: 1415 Agitated Behavior Scale (DO NOT LEAVE BLANKS) Short attention span, easy distractibility, inability to concentrate: Absent Impulsive, impatient, low tolerance for pain or frustration: Absent Uncooperative, resistant to care, demanding: Absent Violent and/or threatening violence toward people or property: Absent Explosive and/or unpredictable anger: Absent Rocking, rubbing, moaning, or other self-stimulating behavior: Absent Pulling at tubes, restraints, etc.: Absent Wandering from treatment areas: Absent Restlessness, pacing, excessive movement: Absent Repetitive behaviors, motor, and/or verbal: Absent Rapid, loud, or excessive talking: Absent Sudden changes of mood: Absent Easily initiated or excessive crying and/or laughter: Absent Self-abusiveness, physical and/or verbal: Absent Agitated behavior scale total score: 14  ADL: ADL Eating: NPO Grooming: Contact guard Where Assessed-Grooming: Edge of bed Upper Body Bathing: Minimal assistance Where Assessed-Upper Body Bathing: Edge of bed Lower Body Bathing: Maximal assistance Where Assessed-Lower Body Bathing: Bed level Upper Body Dressing: Minimal assistance Where Assessed-Upper Body Dressing: Edge of bed Lower Body Dressing: Dependent Where Assessed-Lower Body Dressing: Bed level Toileting: Dependent Where Assessed-Toileting: Bed level Toilet Transfer: Unable to assess Tub/Shower Transfer: Unable to assess Vision   Perception    Praxis   Exercises:   Other Treatments:     Therapy/Group: Individual Therapy  Tonny Branch 06/30/2021, 4:40 PM

## 2021-07-01 DIAGNOSIS — R1312 Dysphagia, oropharyngeal phase: Secondary | ICD-10-CM | POA: Diagnosis not present

## 2021-07-01 DIAGNOSIS — S069X3S Unspecified intracranial injury with loss of consciousness of 1 hour to 5 hours 59 minutes, sequela: Secondary | ICD-10-CM | POA: Diagnosis not present

## 2021-07-01 DIAGNOSIS — E119 Type 2 diabetes mellitus without complications: Secondary | ICD-10-CM | POA: Diagnosis not present

## 2021-07-01 DIAGNOSIS — A0472 Enterocolitis due to Clostridium difficile, not specified as recurrent: Secondary | ICD-10-CM | POA: Diagnosis not present

## 2021-07-01 LAB — GLUCOSE, CAPILLARY
Glucose-Capillary: 122 mg/dL — ABNORMAL HIGH (ref 70–99)
Glucose-Capillary: 114 mg/dL — ABNORMAL HIGH (ref 70–99)
Glucose-Capillary: 129 mg/dL — ABNORMAL HIGH (ref 70–99)
Glucose-Capillary: 41 mg/dL — CL (ref 70–99)
Glucose-Capillary: 61 mg/dL — ABNORMAL LOW (ref 70–99)
Glucose-Capillary: 78 mg/dL (ref 70–99)

## 2021-07-01 MED ORDER — QUETIAPINE FUMARATE 25 MG PO TABS
25.0000 mg | ORAL_TABLET | Freq: Every day | ORAL | Status: DC
  Administered 2021-07-01 – 2021-07-04 (×4): 25 mg via ORAL
  Filled 2021-07-01 (×4): qty 1

## 2021-07-01 MED ORDER — POLYVINYL ALCOHOL 1.4 % OP SOLN
2.0000 [drp] | Freq: Three times a day (TID) | OPHTHALMIC | Status: DC
  Administered 2021-07-01 – 2021-07-05 (×12): 2 [drp] via OPHTHALMIC
  Filled 2021-07-01: qty 15

## 2021-07-01 NOTE — Progress Notes (Signed)
Occupational Therapy Session Note  Patient Details  Name: John Bryan MRN: [id number] Date of Birth: [birth year]  Today's Date: 07/01/2021 OT Individual Time: 1430-1453 OT Individual Time Calculation (min): 23 min    Short Term Goals: Week 1:  OT Short Term Goal 1 (Week 1): Pt will tolerate EOB ADLs with no supine rest breaks to increase functional activity tolerance OT Short Term Goal 1 - Progress (Week 1): Met OT Short Term Goal 2 (Week 1): Pt will don pants with max A OT Short Term Goal 2 - Progress (Week 1): Progressing toward goal OT Short Term Goal 3 (Week 1): Pt will complete sit > stand for toileting tasks with max A OT Short Term Goal 3 - Progress (Week 1): Met OT Short Term Goal 4 (Week 1): Pt will demonstrate improved intellectual awareness of deficits with mod cueing OT Short Term Goal 4 - Progress (Week 1): Met  Skilled Therapeutic Interventions/Progress Updates:     Pt received in bed asleep with increased time to arouse with no pain reported  ADL:  Massed practice of AE for LB dressing d/t poor ROM in R hip decreasing ability to thread RLE and don socks. Pt requires demo of sock aide and dressing stick to doff socks and simulate threading RLE into pants. Pt requires 3 trials of sock aide to complete without cuing. Carryover over next few days would be helpful. Exited session with pt seated in bed, exit alarm on and call light in reach    Therapy Documentation Precautions:  Precautions Precautions: Fall Precaution Comments: G-tube, fall, L 5th digit WBAT, sinus precautions Restrictions Weight Bearing Restrictions: No LUE Weight Bearing: Weight bearing as tolerated General:   Vital Signs: Therapy Vitals Temp: 97.9 F (36.6 C) Pulse Rate: 62 Resp: 18 BP: 118/60 Patient Position (if appropriate): Lying Oxygen Therapy SpO2: 97 % O2 Device: Room Air Pain:   ADL: ADL Eating: NPO Grooming: Contact guard Where Assessed-Grooming: Edge of bed Upper  Body Bathing: Minimal assistance Where Assessed-Upper Body Bathing: Edge of bed Lower Body Bathing: Maximal assistance Where Assessed-Lower Body Bathing: Bed level Upper Body Dressing: Minimal assistance Where Assessed-Upper Body Dressing: Edge of bed Lower Body Dressing: Dependent Where Assessed-Lower Body Dressing: Bed level Toileting: Dependent Where Assessed-Toileting: Bed level Toilet Transfer: Unable to assess Tub/Shower Transfer: Unable to assess Vision   Perception    Praxis   Exercises:   Other Treatments:     Therapy/Group: Individual Therapy  Tonny Branch 07/01/2021, 6:46 AM

## 2021-07-01 NOTE — Progress Notes (Addendum)
Speech Language Pathology Daily TBI Session Note  Patient Details  Name: John Bryan MRN: [id number] Date of Birth: [birth year]  Today's Date: 07/01/2021 SLP Individual Time: 1240-1325 SLP Individual Time Calculation (min): 45 min  Short Term Goals: Week 5: SLP Short Term Goal 1 (Week 5): STGs=LTGs due to ELOS  Skilled Therapeutic Interventions: Skilled treatment session focused on dysphagia and cognitive goals. SLP facilitated session by providing Min A for patient to stand to transfer to the wheelchair to maximize positioning for PO intake. Patient consumed lunch meal of Dys. 3 textures with thin liquids with intermittent overt cough X 2, suspect due to dry cake. Patient independently utilized small bites and a liquid wash to help clear oral residuals. Recommend patient continue current diet. Patient continues to appear brighter in treatment sessions with mild language of confusion noted regarding timing of recent events, etc. Function is impacted by decreased vision and hearing acuity. Patient transferred back to bed at end of session and left with alarm on and all needs within reach. Continue with current plan of care.      Pain Pain Assessment Pain Scale: 0-10 Pain Score: 0-No pain    Agitated Behavior Scale: TBI Observation Details Observation Environment: Patient's room Start of observation period - Date: 07/01/21 Start of observation period - Time: 1240 End of observation period - Date: 07/01/21 End of observation period - Time: 1325 Agitated Behavior Scale (DO NOT LEAVE BLANKS) Short attention span, easy distractibility, inability to concentrate: Absent Impulsive, impatient, low tolerance for pain or frustration: Absent Uncooperative, resistant to care, demanding: Absent Violent and/or threatening violence toward people or property: Absent Explosive and/or unpredictable anger: Absent Rocking, rubbing, moaning, or other self-stimulating behavior: Absent Pulling at  tubes, restraints, etc.: Absent Wandering from treatment areas: Absent Restlessness, pacing, excessive movement: Absent Repetitive behaviors, motor, and/or verbal: Absent Rapid, loud, or excessive talking: Absent Sudden changes of mood: Absent Easily initiated or excessive crying and/or laughter: Absent Self-abusiveness, physical and/or verbal: Absent Agitated behavior scale total score: 14  Therapy/Group: Individual Therapy  John Bryan 07/01/2021, 1:30 PM

## 2021-07-01 NOTE — Progress Notes (Addendum)
Blood sugar 41. 8oz. Orange juice given. Patient alert and oriented.  Asymptomatic. Hypoglycemic protocol initiated.  Will continue to monitor
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
## 2021-07-01 NOTE — Progress Notes (Signed)
Patient ID: John Bryan, male   DOB: [birth year], 70 y.o.   MRN: [id number]  [facility] reported pt dtr asked if a BSC was needed. SW informed will f/u with therapy. SW waiting to know if item is needed.   Cecile Sheerer, MSW, LCSWA Office: [phone] Cell: [phone] Fax: [phone]

## 2021-07-01 NOTE — Progress Notes (Signed)
Physical Therapy Session Note  Patient Details  Name: John Bryan MRN: [id number] Date of Birth: [birth year]  Today's Date: 07/01/2021 PT Individual Time: 0109-3235 PT Individual Time Calculation (min): 60 min   Short Term Goals: Week 1:  PT Short Term Goal 1 (Week 1): Pt will transfer with mod assist  and LRAD to WC PT Short Term Goal 1 - Progress (Week 1): Progressing toward goal PT Short Term Goal 2 (Week 1): Pt will tolerate sitting in WC>2 hours between therapies PT Short Term Goal 2 - Progress (Week 1): Progressing toward goal PT Short Term Goal 3 (Week 1): Pt will ambulate 95f with mod assist and LRAD PT Short Term Goal 3 - Progress (Week 1): Progressing toward goal Week 2:  PT Short Term Goal 1 (Week 2): Pt will transfer with mod assist  and LRAD to WC PT Short Term Goal 1 - Progress (Week 2): Met PT Short Term Goal 2 (Week 2): Pt will tolerate sitting in WC 1 hour between therapies PT Short Term Goal 2 - Progress (Week 2): Progressing toward goal PT Short Term Goal 3 (Week 2): Pt will ambulate 271fwith mod assist and LRAD PT Short Term Goal 3 - Progress (Week 2): Met Week 3:  PT Short Term Goal 1 (Week 3): Patient will perform bed mobility with CGA consistently with use of hospital bed features. PT Short Term Goal 1 - Progress (Week 3): Progressing toward goal PT Short Term Goal 2 (Week 3): Patient will perform basic transfers with min A >50% of the time using LRAD. PT Short Term Goal 2 - Progress (Week 3): Met PT Short Term Goal 3 (Week 3): Patient will ambulate 50 ft with CGA using RW. PT Short Term Goal 3 - Progress (Week 3): Met PT Short Term Goal 4 (Week 3): Patient will initiate stair training. PT Short Term Goal 4 - Progress (Week 3): Met  Skilled Therapeutic Interventions/Progress Updates:   Pt initially supine, saturated brief and bed.  NT notified and agreed to assist pt.  Educated NT pt needing session to focus on stair training in prep for DC, time would not  allow for ADLs and necessary PT. Returned x 45 min.  Pt awake, alert, agreeable.   Dons pants w/assist to thread feet and raise to knees.  Pt then able to raise shorts fully via rolling w/supervision. Supine to sit w/rail and supervision. Donned one Ted hose, no second available.  Pt dons shirt in sitting w/cues and additional time. Sit to stand to RW w/cga w/bed elevated somewhat, tries unsuccessfully x 1 then repositions and achieves standing. Gait [id number] wc in hallway w/cga only, turns/sits w/minimal cueing for safety. Pt transported to gym. Sit to stand at stairs w/2 attempts then successful in achieving upright when pt used improved mechanics, no cueing provided but pt able to make necessary adjustments. Stairs: Pt ascended/descended 4 stairs w/2 rails w/min to cga.  Able to lead w/either foot but more stable leading w/R.  Turns at landing w/cga only.  Pt then instructed w/technique for ascending forward, descending via backing for single step as goal for today. Pt Sit to stand using single handrail to "pull to standing" from wc (pt not able to hear therapist cueing for safer method) then ascended/descended 2 stairs using lateral approach/L handrail, min assist.  Backs down stairs as turning deemed unsafe on second step, may be safest procedure w/single rail but will need to further assess as endurance improves.   Pt c/o  need for BM  transported to room. Sit to stand from wc w/cga on first effort.  Gait 67f to commode and commode transfer including clothing management w/cga.  Pt handed off to NT as session timed out.  Therapy Documentation Precautions:  Precautions Precautions: Fall Precaution Comments: G-tube, fall, L 5th digit WBAT, sinus precautions Restrictions Weight Bearing Restrictions: No LUE Weight Bearing: Weight bearing as tolerated  Pain: denies pain Pain Assessment Pain Scale: 0-10 Pain Score: 0-No pain Therapy/Group: Individual Therapy BCallie Fielding  PShackelford7/29/2022, 12:33 PM

## 2021-07-01 NOTE — Progress Notes (Signed)
Repeat blood sugar 61.  Patient given additional 8oz. Orange juice.  Patient alert and oriented X4.

## 2021-07-02 DIAGNOSIS — S069X0S Unspecified intracranial injury without loss of consciousness, sequela: Secondary | ICD-10-CM | POA: Diagnosis not present

## 2021-07-02 DIAGNOSIS — G44311 Acute post-traumatic headache, intractable: Secondary | ICD-10-CM | POA: Diagnosis not present

## 2021-07-02 LAB — GLUCOSE, CAPILLARY
Glucose-Capillary: 108 mg/dL — ABNORMAL HIGH (ref 70–99)
Glucose-Capillary: 147 mg/dL — ABNORMAL HIGH (ref 70–99)
Glucose-Capillary: 153 mg/dL — ABNORMAL HIGH (ref 70–99)
Glucose-Capillary: 179 mg/dL — ABNORMAL HIGH (ref 70–99)
Glucose-Capillary: 82 mg/dL (ref 70–99)

## 2021-07-02 MED ORDER — INSULIN ASPART PROT & ASPART (70-30 MIX) 100 UNIT/ML ~~LOC~~ SUSP
18.0000 [IU] | Freq: Two times a day (BID) | SUBCUTANEOUS | Status: DC
  Administered 2021-07-02 – 2021-07-05 (×6): 18 [IU] via SUBCUTANEOUS
  Filled 2021-07-02: qty 10

## 2021-07-02 NOTE — Progress Notes (Signed)
Occupational Therapy TBI Note  Patient Details  Name: DASANI CREAR MRN: [id number] Date of Birth: [birth year]  Today's Date: 07/02/2021 OT Individual Time: 2482-5003 OT Individual Time Calculation (min): 75 min      Short Term Goals: Week 1:  OT Short Term Goal 1 (Week 1): Pt will tolerate EOB ADLs with no supine rest breaks to increase functional activity tolerance OT Short Term Goal 1 - Progress (Week 1): Met OT Short Term Goal 2 (Week 1): Pt will don pants with max A OT Short Term Goal 2 - Progress (Week 1): Progressing toward goal OT Short Term Goal 3 (Week 1): Pt will complete sit > stand for toileting tasks with max A OT Short Term Goal 3 - Progress (Week 1): Met OT Short Term Goal 4 (Week 1): Pt will demonstrate improved intellectual awareness of deficits with mod cueing OT Short Term Goal 4 - Progress (Week 1): Met  Skilled Therapeutic Interventions/Progress Updates:     Pt received in bed with mild back pain that is "coming along" and heat used for the shower for heat for pain relief  ADL:  Pt completes bathing with CGA for sit to stand with grab bar to wash butocks and LHSS to wash BLE Pt completes UB dressing with set up Pt completes LB dressing with CGA for standing balance while pt pulls pants past hips. Dressing stick used for threading BLE into pants Pt completes footwear with sock aide and dressing stick to don/doff non skid socks Pt completes toileting with CGA Pt completes toileting transfer with CGA and use of RW at ambulatory level Pt completes shower/Tub transfer with BSC in shower and RW with CGA Pt practices getting into/out of regular bed with close supervision for Sup<>sit and no bed rails  Pt left at end of session in w.c with exit alarm on, call light in reach and all needs met   Therapy Documentation Precautions:  Precautions Precautions: Fall Precaution Comments: G-tube, fall, L 5th digit WBAT, sinus precautions Restrictions Weight Bearing  Restrictions: Yes LUE Weight Bearing: Weight bearing as tolerated General:   Vital Signs:   Pain: Pain Assessment Pain Scale: 0-10 Pain Score: 6  Pain Type: Acute pain Pain Location: Back Pain Descriptors / Indicators: Aching;Throbbing Pain Frequency: Intermittent Pain Onset: On-going Pain Intervention(s): Medication (See eMAR) Agitated Behavior Scale: TBI  Observation Details Observation Environment: pt room Start of observation period - Date: 07/02/21 Start of observation period - Time: 1115 End of observation period - Date: 07/02/21 End of observation period - Time: 1238 Agitated Behavior Scale (DO NOT LEAVE BLANKS) Short attention span, easy distractibility, inability to concentrate: Absent Impulsive, impatient, low tolerance for pain or frustration: Absent Uncooperative, resistant to care, demanding: Absent Violent and/or threatening violence toward people or property: Absent Explosive and/or unpredictable anger: Absent Rocking, rubbing, moaning, or other self-stimulating behavior: Absent Pulling at tubes, restraints, etc.: Absent Wandering from treatment areas: Absent Restlessness, pacing, excessive movement: Absent Repetitive behaviors, motor, and/or verbal: Absent Rapid, loud, or excessive talking: Absent Sudden changes of mood: Absent Easily initiated or excessive crying and/or laughter: Absent Self-abusiveness, physical and/or verbal: Absent Agitated behavior scale total score: 14  ADL: ADL Eating: NPO Grooming: Contact guard Where Assessed-Grooming: Edge of bed Upper Body Bathing: Minimal assistance Where Assessed-Upper Body Bathing: Edge of bed Lower Body Bathing: Maximal assistance Where Assessed-Lower Body Bathing: Bed level Upper Body Dressing: Minimal assistance Where Assessed-Upper Body Dressing: Edge of bed Lower Body Dressing: Dependent Where Assessed-Lower Body Dressing: Bed level Toileting:  Dependent Where Assessed-Toileting: Bed  level Toilet Transfer: Unable to assess Tub/Shower Transfer: Unable to assess Vision   Perception    Praxis   Exercises:   Other Treatments:     Therapy/Group: Individual Therapy  Tonny Branch 07/02/2021, 12:38 PM
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
## 2021-07-02 NOTE — Progress Notes (Signed)
Pt CBG was 41 at approx 2100 07/01/21, tx provided and sugar reached 101, this am was 82, pt didn't want breakfast but did eat pancakes with syrup. Trends have been low, discussed with MD and will change 70/30 as needed.

## 2021-07-02 NOTE — Progress Notes (Signed)
Physical Therapy Session Note  Patient Details  Name: John Bryan MRN: [id number] Date of Birth: [birth year]  Today's Date: 07/02/2021 PT Individual Time: 1535-1630 PT Individual Time Calculation (min): 55 min   Short Term Goals:  Week 4:  PT Short Term Goal 1 (Week 4): STG=LTG due to ELOS.   Skilled Therapeutic Interventions/Progress Updates:   Pt received supine in bed and agreeable to PT. Supine>sit transfer with CGA assist  for safety.   Sit<>stand from EOB with CGA and UE support on RW to doff soiled brief and don clean pants. PT performed clothing management for time. No LOB as pt assisting to position pants with BUE.   RN present to assess Peg tube.   Wife present for education throughout entire session .  Gait training 2 x 52f with supervision assist for safety and BUE support on RW. No LOB noted but occasional cues for AD management in turns.   Sit<>stand transfers with CGA-min assist depending on surface height with occasional cues for use of UE to push from chair arms rest.   .  Stair management training with BUE support on L rail for ascent and R rail for ascent; min assist for safety from PT with only min cues for step to gait pattern initially.   Car transfer training to SUV height of 32 inches. Pt able to pivot to sit on edge of seat and initiate swinging LE into car. Due to car simulator set up, unable to raise LE into floor baords at this time.   Pt returned to room and performed stand pivot transfer to bed with RW and CGA. Sit>supine completed with min assist at the RLE, and left supine in bed with call bell in reach and all needs met.          Therapy Documentation Precautions:  Precautions Precautions: Fall Precaution Comments: G-tube, fall, L 5th digit WBAT, sinus precautions Restrictions Weight Bearing Restrictions: Yes LUE Weight Bearing: Weight bearing as tolerated    Vital Signs: Therapy Vitals Temp: 97.6 F (36.4 C) Temp Source:  Oral Pulse Rate: (!) 108 Resp: 18 BP: 127/89 Patient Position (if appropriate): Lying Oxygen Therapy SpO2: 99 % O2 Device: Room Air Pain:  4/10 low back with movement. Pt repositioned     Therapy/Group: Individual Therapy  ALorie Phenix7/30/2022, 4:38 PM

## 2021-07-03 DIAGNOSIS — G44311 Acute post-traumatic headache, intractable: Secondary | ICD-10-CM | POA: Diagnosis not present

## 2021-07-03 DIAGNOSIS — S069X0S Unspecified intracranial injury without loss of consciousness, sequela: Secondary | ICD-10-CM | POA: Diagnosis not present

## 2021-07-03 LAB — GLUCOSE, CAPILLARY
Glucose-Capillary: 122 mg/dL — ABNORMAL HIGH (ref 70–99)
Glucose-Capillary: 132 mg/dL — ABNORMAL HIGH (ref 70–99)
Glucose-Capillary: 123 mg/dL — ABNORMAL HIGH (ref 70–99)
Glucose-Capillary: 108 mg/dL — ABNORMAL HIGH (ref 70–99)

## 2021-07-03 MED ORDER — GABAPENTIN 100 MG PO CAPS
100.0000 mg | ORAL_CAPSULE | Freq: Three times a day (TID) | ORAL | Status: DC
  Administered 2021-07-03 – 2021-07-04 (×2): 100 mg via ORAL
  Filled 2021-07-03 (×2): qty 1

## 2021-07-03 NOTE — Progress Notes (Signed)
Occupational Therapy Discharge Summary  Patient Details  Name: John Bryan MRN: [id number] Date of Birth: 1951/06/07   Patient has met 3 of 10 long term goals due to improved activity tolerance, improved balance, postural control, ability to compensate for deficits, improved attention, improved awareness, and improved coordination.  Patient to discharge at [facility] Assist level.  Patient's care partner is independent to provide the necessary physical and cognitive assistance at discharge.  Family has been present for 2 session to learn CGA transfers, AE supervision, and BADL safety strategies. Daughter, significant other and son in law present for training  Reasons goals not met: All goals met  Recommendation:  Patient will benefit from ongoing skilled OT services in home health setting to continue to advance functional skills in the area of BADL, iADL, and Reduce care partner burden.  Equipment: TTB and BSC   Reasons for discharge: treatment goals met  Patient/family agrees with progress made and goals achieved: Yes  OT Discharge Precautions/Restrictions  Precautions Precautions: Fall Precaution Comments: G-tube, fall, L 5th digit WBAT, sinus precautions Restrictions Weight Bearing Restrictions: No LUE Weight Bearing: Weight bearing as tolerated  Pain Pain Assessment Pain Scale: 0-10 Pain Score: 8  Pain Type: Acute pain Pain Location: Back Pain Orientation: Lower;Posterior Pain Radiating Towards: and thighs Pain Descriptors / Indicators: Burning;Stabbing;Sharp Pain Frequency: Constant Pain Onset: With Activity (after therapy) Pain Intervention(s): Medication (See eMAR) ADL ADL Eating: Modified independent Where Assessed-Eating: Chair Grooming: Modified independent Where Assessed-Grooming: Edge of bed Upper Body Bathing: Supervision/safety Where Assessed-Upper Body Bathing: Shower Lower Body Bathing: Contact guard Where Assessed-Lower Body Bathing:  Shower Upper Body Dressing: Setup Where Assessed-Upper Body Dressing: Edge of bed Lower Body Dressing: Contact guard Where Assessed-Lower Body Dressing: Wheelchair Toileting: Contact guard Where Assessed-Toileting: Bedside Commode Toilet Transfer: Therapist, music Method: Ambulating Tub/Shower Transfer: Metallurgist Method: Ambulating Vision Baseline Vision/History: No visual deficits Patient Visual Report: Blurring of vision Vision Assessment?: Yes Eye Alignment: Impaired (comment) Ocular Range of Motion: Restricted on the right Alignment/Gaze Preference: Within Defined Limits Tracking/Visual Pursuits: Right eye does not track laterally;Decreased smoothness of vertical tracking;Decreased smoothness of horizontal tracking Saccades: Overshoots Convergence: Impaired (comment) Depth Perception: Overshoots Perception  Perception: Within Functional Limits Figure Ground: impaired Praxis Praxis: Impaired Praxis Impairment Details: Initiation Cognition Overall Cognitive Status: Impaired/Different from baseline Arousal/Alertness: Awake/alert Attention: Sustained Sustained Attention: Impaired Memory: Impaired Safety/Judgment: Impaired [employer] Scales of Cognitive Functioning: Automatic/appropriate Sensation Sensation Light Touch: Appears Intact Coordination Gross Motor Movements are Fluid and Coordinated: No Fine Motor Movements are Fluid and Coordinated: No Finger Nose Finger Test: able to complete wiht L eye occluded Motor  Motor Motor: Abnormal postural alignment and control;Other (comment) Motor - Skilled Clinical Observations: deconditioned Mobility  Bed Mobility Rolling Right: Supervision/verbal cueing Rolling Left: Supervision/Verbal cueing Supine to Sit: Supervision/Verbal cueing Sit to Supine: Supervision/Verbal cueing Transfers Sit to Stand: Contact Guard/Touching assist Stand to Sit: Contact Guard/Touching assist   Trunk/Postural Assessment  Cervical Assessment Cervical Assessment: Exceptions to [facility] (head forward) Thoracic Assessment Thoracic Assessment: Exceptions to [facility] (thoracic hyphosis) Lumbar Assessment Lumbar Assessment: Exceptions to [facility] (post pelvic) Postural Control Postural Control: Deficits on evaluation (deayed but significant improvement since eval)  Balance Static Sitting Balance Static Sitting - Level of Assistance: 6: Modified independent (Device/Increase time) Dynamic Sitting Balance Dynamic Sitting - Level of Assistance: 6: Modified independent (Device/Increase time) Static Standing Balance Static Standing - Level of Assistance: 5: Stand by assistance Dynamic Standing Balance Dynamic Standing - Level of  Assistance: 4: Min assist Extremity/Trunk Assessment RUE Assessment RUE Assessment: Within Functional Limits General Strength Comments: generalized weakness, but improved since eval LUE Assessment LUE Assessment: Within Functional Limits General Strength Comments: generalized weakness, but improved since eval   Tonny Branch 07/03/2021, 4:26 PM
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
## 2021-07-03 NOTE — Progress Notes (Addendum)
Pt had therapy this afternoon and after walking up and down stairs pt is c/o burning to bil thighs with sharp stabbing pains to low back. Provided PRN medications will see if helps with pain. If not, will notify on call.   Pt reports that pain is much better and burning sensation has stopped to his bilateral thighs

## 2021-07-03 NOTE — Progress Notes (Signed)
Wife in Sat at bedside, tried to show her how to flush PEG and said that daughter would be doing that and will be in for family ed on Monday. She requested to wait to be shown until she was here.

## 2021-07-03 NOTE — Progress Notes (Signed)
Speech Language Pathology TBI Note  Patient Details  Name: John Bryan MRN: [id number] Date of Birth: [birth year]  Today's Date: 07/03/2021 SLP Individual Time: 0350-0938 SLP Individual Time Calculation (min): 40 min  Short Term Goals: Week 5: SLP Short Term Goal 1 (Week 5): STGs=LTGs due to ELOS  Skilled Therapeutic Interventions:  Pt was seen for skilled ST targeting cognitive goals.  Upon arrival pt was asleep but was easily awakened to loud voice and light touch.  Pt appeared more confused than typical upon awakening, asking about a ceremony and complaining of [place] getting to the airport.  Pt was easily reoriented to place, date, and situation with min assist verbal cues and he was even able to attribute his brief confusion to waking up quickly from a sound sleep.  Pt was agreeable to getting out of bed for treatment and was able to void into the commode without being incontinent.  During transfers to and from wheelchair, pt demonstrated no overt safety concerns and even asked for additional assistance when he felt "unsure of himself" due to having just woken up.  After going to the bathroom, pt brushed his teeth at the sink independently.  Pt was returned to bed and left with call bell within reach and bed alarm set.  Continue per current plan of care.    Pain Pain Assessment Pain Scale: 0-10 Pain Score: 0-No pain  Agitated Behavior Scale: TBI Observation Details Observation Environment: pt's room Start of observation period - Date: 07/03/21 Start of observation period - Time: 0805 End of observation period - Date: 07/03/21 End of observation period - Time: 0845 Agitated Behavior Scale (DO NOT LEAVE BLANKS) Short attention span, easy distractibility, inability to concentrate: Absent Impulsive, impatient, low tolerance for pain or frustration: Absent Uncooperative, resistant to care, demanding: Absent Violent and/or threatening violence toward people or property:  Absent Explosive and/or unpredictable anger: Absent Rocking, rubbing, moaning, or other self-stimulating behavior: Absent Pulling at tubes, restraints, etc.: Absent Wandering from treatment areas: Absent Restlessness, pacing, excessive movement: Absent Repetitive behaviors, motor, and/or verbal: Absent Rapid, loud, or excessive talking: Absent Sudden changes of mood: Absent Easily initiated or excessive crying and/or laughter: Absent Self-abusiveness, physical and/or verbal: Absent Agitated behavior scale total score: 14  Therapy/Group: Individual Therapy  Michelle Wnek, Melanee Spry 07/03/2021, 12:28 PM

## 2021-07-03 NOTE — Progress Notes (Signed)
Physical Therapy TBI Note  Patient Details  Name: John Bryan MRN: [id number] Date of Birth: July 27, 1951  Today's Date: 07/03/2021 PT Individual Time: 1105-1200 PT Individual Time Calculation (min): 55 min   Short Term Goals: Week 4:  PT Short Term Goal 1 (Week 4): STG=LTG due to ELOS.  Skilled Therapeutic Interventions/Progress Updates:     Patient in bed upon PT arrival. Patient alert and agreeable to PT session. Patient denied pain during session.  Patient noted to have improved awareness and reduced confabulation throughout session today.   Therapeutic Activity: Bed Mobility: Patient performed supine to sit with mod I with use of hospital bed features. Provided verbal cues for log roll technique and reducing use . Transfers: Patient performed sit to/from stand from elevated bed to simulate home set-up x1, BSC over the toilet x1, and w/c x3 with CGA using RW. Provided verbal cues for scooting forward and forward weight shift. Patient was continent of bladder during toileting, performed peri-care with set-up assist and lower body clothing management with total A for energy/time management.   Gait Training:  Patient ambulated 18 feet and 12 feet using RW with CGA for safety/balance. Ambulated with decreased gait speed, decreased step length and height, increased B hip and knee flexion in stance, forward trunk lean, and downward head gaze. Provided verbal cues for looking ahead, erect posture, and safe proximity to RW. Patient ascended/descended 4x6" steps using L rail with CGA. Performed step-to gait pattern leading with R while ascending and L while descending. Patient able to teach back technique and sequencing.   Wheelchair Mobility:  Patient propelled wheelchair 74 feet with supervision using B upper extremities. Provided verbal cues for equal propulsion as patient tends to veer slightly to the R.   Patient in w/c in the room agreeable to sit up to eat lunch and until OT session  at 1pm at end of session with breaks locked, chair alarm set, and all needs within reach.   Therapy Documentation Precautions:  Precautions Precautions: Fall Precaution Comments: G-tube, fall, L 5th digit WBAT, sinus precautions Restrictions Weight Bearing Restrictions: No LUE Weight Bearing: Weight bearing as tolerated Agitated Behavior Scale: TBI Observation Details Observation Environment: CIR Start of observation period - Date: 07/03/21 Start of observation period - Time: 1105 End of observation period - Date: 07/03/21 End of observation period - Time: 1200 Agitated Behavior Scale (DO NOT LEAVE BLANKS) Short attention span, easy distractibility, inability to concentrate: Absent Impulsive, impatient, low tolerance for pain or frustration: Absent Uncooperative, resistant to care, demanding: Absent Violent and/or threatening violence toward people or property: Absent Explosive and/or unpredictable anger: Absent Rocking, rubbing, moaning, or other self-stimulating behavior: Absent Pulling at tubes, restraints, etc.: Absent Wandering from treatment areas: Absent Restlessness, pacing, excessive movement: Absent Repetitive behaviors, motor, and/or verbal: Absent Rapid, loud, or excessive talking: Absent Sudden changes of mood: Absent Easily initiated or excessive crying and/or laughter: Absent Self-abusiveness, physical and/or verbal: Absent Agitated behavior scale total score: 14     Therapy/Group: Individual Therapy  [name] PT, DPT  07/03/2021, 4:10 PM

## 2021-07-03 NOTE — Progress Notes (Signed)
Occupational Therapy TBI Note  Patient Details  Name: John Bryan MRN: [id number] Date of Birth: 07-19-1951  Today's Date: 07/03/2021 OT Individual Time: 1300-1330 OT Individual Time Calculation (min): 30 min    Short Term Goals: Week 1:  OT Short Term Goal 1 (Week 1): Pt will tolerate EOB ADLs with no supine rest breaks to increase functional activity tolerance OT Short Term Goal 1 - Progress (Week 1): Met OT Short Term Goal 2 (Week 1): Pt will don pants with max A OT Short Term Goal 2 - Progress (Week 1): Progressing toward goal OT Short Term Goal 3 (Week 1): Pt will complete sit > stand for toileting tasks with max A OT Short Term Goal 3 - Progress (Week 1): Met OT Short Term Goal 4 (Week 1): Pt will demonstrate improved intellectual awareness of deficits with mod cueing OT Short Term Goal 4 - Progress (Week 1): Met  Skilled Therapeutic Interventions/Progress Updates:     Pt received in bed with back pain RN delivering meds  Therapeutic activity Visual scanning actiivyt with alternating eye patch to improve use of B eyes. Pt demo vertical and inward tracking with R eye, however unable to track laterally to R. L eye requires significant cuing to track stimulus with R eye covered and maintain visual fiation on stimulus, but able to move in all directions. Pt only able ot compelte letter cancelation with L eye occluded. Educated on importance of changing eye patching to work on B eye vision except when doing mobility that vision impacts safety  (stairs, transfers etc) for now.  Pt left at end of session in bed with exit alarm on, call light in reach and all needs met   Therapy Documentation Precautions:  Precautions Precautions: Fall Precaution Comments: G-tube, fall, L 5th digit WBAT, sinus precautions Restrictions Weight Bearing Restrictions: No LUE Weight Bearing: Weight bearing as tolerated General:   Vital Signs: Therapy Vitals Temp: (!) 97.4 F (36.3 C) Temp  Source: Oral Pulse Rate: 72 Resp: 18 BP: 138/63 Patient Position (if appropriate): Lying Oxygen Therapy SpO2: 99 % O2 Device: Room Air Pain: Pain Assessment Pain Scale: 0-10 Pain Score: 8  Pain Type: Acute pain Pain Location: Back Pain Orientation: Lower;Posterior Pain Radiating Towards: and thighs Pain Descriptors / Indicators: Burning;Stabbing;Sharp Pain Frequency: Constant Pain Onset: With Activity (after therapy) Pain Intervention(s): Medication (See eMAR) Agitated Behavior Scale: TBI  Observation Details Observation Environment: CIR Start of observation period - Date: 07/03/21 Start of observation period - Time: 1300 End of observation period - Date: 07/03/21 End of observation period - Time: 1330 Agitated Behavior Scale (DO NOT LEAVE BLANKS) Short attention span, easy distractibility, inability to concentrate: Absent Impulsive, impatient, low tolerance for pain or frustration: Absent Uncooperative, resistant to care, demanding: Absent Violent and/or threatening violence toward people or property: Absent Explosive and/or unpredictable anger: Absent Rocking, rubbing, moaning, or other self-stimulating behavior: Absent Pulling at tubes, restraints, etc.: Absent Wandering from treatment areas: Absent Restlessness, pacing, excessive movement: Absent Repetitive behaviors, motor, and/or verbal: Absent Rapid, loud, or excessive talking: Absent Sudden changes of mood: Absent Easily initiated or excessive crying and/or laughter: Absent Self-abusiveness, physical and/or verbal: Absent Agitated behavior scale total score: 14  ADL: ADL Eating: NPO Grooming: Contact guard Where Assessed-Grooming: Edge of bed Upper Body Bathing: Minimal assistance Where Assessed-Upper Body Bathing: Edge of bed Lower Body Bathing: Maximal assistance Where Assessed-Lower Body Bathing: Bed level Upper Body Dressing: Minimal assistance Where Assessed-Upper Body Dressing: Edge of bed Lower  Body Dressing:  Dependent Where Assessed-Lower Body Dressing: Bed level Toileting: Dependent Where Assessed-Toileting: Bed level Toilet Transfer: Unable to assess Tub/Shower Transfer: Unable to assess Vision   Perception    Praxis   Exercises:   Other Treatments:     Therapy/Group: Individual Therapy  Tonny Branch 07/03/2021, 4:15 PM

## 2021-07-04 DIAGNOSIS — S069X3S Unspecified intracranial injury with loss of consciousness of 1 hour to 5 hours 59 minutes, sequela: Secondary | ICD-10-CM | POA: Diagnosis not present

## 2021-07-04 DIAGNOSIS — R1312 Dysphagia, oropharyngeal phase: Secondary | ICD-10-CM | POA: Diagnosis not present

## 2021-07-04 DIAGNOSIS — E119 Type 2 diabetes mellitus without complications: Secondary | ICD-10-CM | POA: Diagnosis not present

## 2021-07-04 DIAGNOSIS — A0472 Enterocolitis due to Clostridium difficile, not specified as recurrent: Secondary | ICD-10-CM | POA: Diagnosis not present

## 2021-07-04 LAB — CBC
HCT: 36.9 % — ABNORMAL LOW (ref 39.0–52.0)
Hemoglobin: 12 g/dL — ABNORMAL LOW (ref 13.0–17.0)
MCH: 29.6 pg (ref 26.0–34.0)
MCHC: 32.5 g/dL (ref 30.0–36.0)
MCV: 91.1 fL (ref 80.0–100.0)
Platelets: 227 10*3/uL (ref 150–400)
RBC: 4.05 MIL/uL — ABNORMAL LOW (ref 4.22–5.81)
RDW: 14.7 % (ref 11.5–15.5)
WBC: 4.3 10*3/uL (ref 4.0–10.5)
nRBC: 0 % (ref 0.0–0.2)

## 2021-07-04 LAB — GLUCOSE, CAPILLARY
Glucose-Capillary: 102 mg/dL — ABNORMAL HIGH (ref 70–99)
Glucose-Capillary: 156 mg/dL — ABNORMAL HIGH (ref 70–99)
Glucose-Capillary: 116 mg/dL — ABNORMAL HIGH (ref 70–99)
Glucose-Capillary: 103 mg/dL — ABNORMAL HIGH (ref 70–99)

## 2021-07-04 LAB — BASIC METABOLIC PANEL
Anion gap: 6 (ref 5–15)
BUN: 11 mg/dL (ref 8–23)
CO2: 23 mmol/L (ref 22–32)
Calcium: 8.6 mg/dL — ABNORMAL LOW (ref 8.9–10.3)
Chloride: 109 mmol/L (ref 98–111)
Creatinine, Ser: 1.01 mg/dL (ref 0.61–1.24)
GFR, Estimated: >60 mL/min (ref >60)
Glucose, Bld: 110 mg/dL — ABNORMAL HIGH (ref 70–99)
Potassium: 4 mmol/L (ref 3.5–5.1)
Sodium: 138 mmol/L (ref 135–145)

## 2021-07-04 NOTE — Progress Notes (Signed)
Education done with daughter and wife on irrigating peg tube. Daughter demonstrated understanding of teaching. No further questions.   Marylu Lund, RN
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
## 2021-07-04 NOTE — Progress Notes (Signed)
Occupational Therapy TBI Note  Patient Details  Name: John Bryan MRN: [id number] Date of Birth: Apr 19, 1951  Today's Date: 07/04/2021 OT Individual Time: 1350-1430 OT Individual Time Calculation (min): 40 min    Short Term Goals:  Week 4:  OT Short Term Goal 1 (Week 4): STG= LTG d/t ELOS  Skilled Therapeutic Interventions/Progress Updates:    Session focused on family education with pt's daughter, son in law, and SO. Pt able to demonstrate bed mobility with supervision ,edu on log rolling technique and possible use of bed rail if needed at home. CGA for stand pivot transfer to the w/c. Demonstration and edu re pt's CLOF with vision deficits- edu on alternating patching, ocular ROM exercises, and recommended f/u with neuro-ophthalmologist. Provided edu and demo on use of TTB. Pt returned demo with mod cueing and CGA provided by his SO. His SO required increased cueing for more stable hands on support and remaining close to pt. Reviewed fall reduction strategies and general energy conservation strategies. Pt was passed off to PT in tub room.   Therapy Documentation Precautions:  Precautions Precautions: Fall Precaution Comments: G-tube, fall, L 5th digit WBAT, sinus precautions Restrictions Weight Bearing Restrictions: No LUE Weight Bearing: Weight bearing as tolerated  Agitated Behavior Scale: TBI Observation Details Observation Environment: CIR Start of observation period - Date: 07/04/21 Start of observation period - Time: 1350 End of observation period - Date: 07/04/21 End of observation period - Time: 1430 Agitated Behavior Scale (DO NOT LEAVE BLANKS) Short attention span, easy distractibility, inability to concentrate: Absent Impulsive, impatient, low tolerance for pain or frustration: Absent Uncooperative, resistant to care, demanding: Absent Violent and/or threatening violence toward people or property: Absent Explosive and/or unpredictable anger: Absent Rocking,  rubbing, moaning, or other self-stimulating behavior: Absent Pulling at tubes, restraints, etc.: Absent Wandering from treatment areas: Absent Restlessness, pacing, excessive movement: Absent Repetitive behaviors, motor, and/or verbal: Absent Rapid, loud, or excessive talking: Absent Sudden changes of mood: Absent Easily initiated or excessive crying and/or laughter: Absent Self-abusiveness, physical and/or verbal: Absent Agitated behavior scale total score: 14    Therapy/Group: Individual Therapy  Crissie Reese 07/04/2021, 3:10 PM

## 2021-07-04 NOTE — Progress Notes (Addendum)
Physical Therapy Discharge Summary  Patient Details  Name: John Bryan MRN: [id number] Date of Birth: September 24, 1951  Today's Date: 07/04/2021 PT Individual Time: 1430-1510 PT Individual Time Calculation (min): 40 min    Patient has met 10 of 11 long term goals due to improved activity tolerance, improved balance, improved postural control, increased strength, decreased pain, ability to compensate for deficits, improved attention, improved awareness, and improved coordination.  Patient to discharge at an ambulatory level for household mobility, wheelchair community level [place].   Patient's care partner is independent to provide the necessary physical and cognitive assistance at discharge.  Reasons goals not met: Patient unable to tolerate w/c mobility 150 ft due to shoulder pain and decreased activity tolerance. Patient's family able to compensate with dependent community level w/c propulsion.   Recommendation:  Patient will benefit from ongoing skilled PT services in home health setting to continue to advance safe functional mobility, address ongoing impairments in balance, strength, activity tolerance, attention, awareness, gait and stair training, patient/caregiver education, and minimize fall risk.  Equipment: RW, 20"x18" light weight wheelchair  Reasons for discharge: treatment goals met  Patient/family agrees with progress made and goals achieved: Yes  Skilled Therapeutic Intervention: Patient in ADL bathroom with his family present to continue family education when received from [place], [place] Patient alert and agreeable to PT session. Patient denied pain during session.  Patient's family participated in hands on training focused on car transfers, stair management, and basic transfer/gait training. Educated on fall risk/prevention, home modifications to prevent falls, and activation of emergency services in the event of a fall during session. Discussed w/c management in the home  and community and management of w/c parts for transport. Patient's family reported having experience with w/c management and demonstrated confident use of all parts throughout session. Family performed all mobility with patient, following PT demonstration/instruction, demonstrating safe guarding and cuing throughout. Educated on use of RW for household ambulation and w/c for community mobility at this time, family and patient in agreement. Prescribed HEP of household mobility rather than focused strengthening exercises to promote increased activity tolerance with daily activities. Educated on changing positions every hour while awake and encouraging patient to ambulate to go to the bathroom or retrieve items or perform tasks on his own with CGA from family for safety. Encouraged walking sessions at least 3x per day of at least 25-50 ft in the home. Patient and family in agreement.   Therapeutic Activity: Transfers: Patient performed sit to/from stand from TTB and w/c with CGA using RW. Provided verbal cues for hand placement and forward weight shift. Patient performed a simulated SUV height car transfer with min A for lower extremity management using RW. Provided cues for safe technique, leaning the seat back to allow for lower extremities in/out of car with reduced strain, and for patient to use running board to scoot hips back onto the seat if patient is perched on the seat when first sitting down. Educated on providing patient with a blank page with a single letter on it to focus on during the ride home if the visual stimulus of the car moving induced dizziness or nausea due to visual deficits.  Patient had to use the urinal at end of session. Patient's daughter and girlfriend provided safe guarding and assist with this with patient in standing using RW for balance.   Gait Training:  Patient ambulated 15 feet using RW with CGA-close supervision. Ambulated with decreased gait speed, decreased step length  and height, increased  B hip and knee flexion in stance, forward trunk lean, and downward head gaze. Provided verbal cues for erect posture and increased visual scanning due to visual deficits. Patient ascended/descended 5x6" steps using L rail with CGA. Performed step-to gait pattern leading with R while ascending and L while descending. Patient able to teach back technique and sequencing. Patient's daughter able to demonstrate safe guarding and teach back sequencing during training. Demonstrated technique for management of 2 small steps in the home using RW and instructed on safe guarding technique. Patient declined performing task due to fatigue with back-to-back family education sessions. Family stated understanding of assist and technique and denied questions or concerns at this time.  Wheelchair Mobility:  Patient propelled wheelchair in the room with supervision.  Patient sitting EOB with his family in the room at end of session with breaks locked and all needs within reach. RN alerted to bed alarm being off due to family being present and family instructed to inform nursing when they depart.   PT Discharge Precautions/Restrictions Precautions Precautions: Fall Precaution Comments: G-tube, fall, L 5th digit WBAT, sinus precautions Restrictions LUE Weight Bearing: Weight bearing as tolerated Vision/Perception  Vision - Assessment Eye Alignment: Impaired (comment) Ocular Range of Motion: Restricted on the right Alignment/Gaze Preference: Within Defined Limits Tracking/Visual Pursuits: Right eye does not track laterally;Decreased smoothness of vertical tracking;Decreased smoothness of horizontal tracking;Decreased smoothness of eye movement to RIGHT superior field;Decreased smoothness of eye movement to RIGHT inferior field Saccades: Overshoots Convergence: Impaired (comment) Perception Perception: Impaired Figure Ground: impaired Praxis Praxis: Impaired Praxis Impairment Details:  Initiation  Cognition Overall Cognitive Status: Impaired/Different from baseline Arousal/Alertness: Awake/alert Attention: Sustained Sustained Attention: Impaired Sustained Attention Impairment: Verbal basic;Functional basic Memory: Impaired Memory Impairment: Decreased recall of new information Awareness Impairment: Emergent impairment Problem Solving: Impaired Problem Solving Impairment: Verbal basic;Functional basic Behaviors: Poor frustration tolerance Safety/Judgment: Impaired [place] [employer] Scales of Cognitive Functioning: Automatic/appropriate Sensation Sensation Light Touch: Appears Intact Proprioception: Appears Intact Coordination Gross Motor Movements are Fluid and Coordinated: No Fine Motor Movements are Fluid and Coordinated: No Coordination and Movement Description: deconditioning with mild dysmetria Heel Shin Test: unable to tolerate due to back pain Motor  Motor Motor: Abnormal postural alignment and control;Other (comment) Motor - Discharge Observations: Decreased balance strategies and postural control with mild dysmetria and generalized deconditioning  Mobility Bed Mobility Rolling Right: Supervision/verbal cueing Rolling Left: Supervision/Verbal cueing Supine to Sit: Supervision/Verbal cueing Sit to Supine: Supervision/Verbal cueing Transfers Transfers: Stand Pivot Transfers Sit to Stand: Contact Guard/Touching assist Stand to Sit: Contact Guard/Touching assist Stand Pivot Transfers: Contact Guard/Touching assist Transfer (Assistive device): Rolling walker Locomotion  Gait Ambulation: Yes Gait Assistance: Contact Guard/Touching assist Gait Distance (Feet): 50 Feet Assistive device: Rolling walker Gait Gait: Yes Gait Pattern: Impaired Gait Pattern: Step-through pattern;Decreased stride length;Decreased hip/knee flexion - right;Decreased hip/knee flexion - left;Decreased dorsiflexion - right;Decreased dorsiflexion - left;Right flexed knee in  stance;Left flexed knee in stance;Decreased trunk rotation;Trunk flexed Gait velocity: decreased Stairs / Additional Locomotion Stairs: Yes Stairs Assistance: Contact Guard/Touching assist Stair Management Technique: One rail Left Number of Stairs: 4 Height of Stairs: 6 Curb: Contact Guard/Touching assist (using RW) Product manager Mobility: Yes Wheelchair Assistance: Chartered loss adjuster: Both upper extremities Wheelchair Parts Management: Needs assistance Distance: 75 ft  Trunk/Postural Assessment  Cervical Assessment Cervical Assessment: Exceptions to [facility] (head forward) Thoracic Assessment Thoracic Assessment: Exceptions to [facility] (thoracic hyphosis) Lumbar Assessment Lumbar Assessment: Exceptions to [facility] (post pelvic) Postural Control Postural Control: Deficits on evaluation (deayed  but significant improvement since eval)  Balance Static Sitting Balance Static Sitting - Level of Assistance: 6: Modified independent (Device/Increase time) Dynamic Sitting Balance Dynamic Sitting - Level of Assistance: 6: Modified independent (Device/Increase time) Static Standing Balance Static Standing - Level of Assistance: 5: Stand by assistance Dynamic Standing Balance Dynamic Standing - Balance Support: Right upper extremity supported;Left upper extremity supported;During functional activity Dynamic Standing - Level of Assistance: 4: Min assist Extremity Assessment  RLE Assessment RLE Assessment: Exceptions to [facility] Active Range of Motion (AROM) Comments: decreased DF ROM <5 deg past neutral General Strength Comments: grossly 4+ to 5/5 throughout in sitting LLE Assessment LLE Assessment: Exceptions to [facility] Active Range of Motion (AROM) Comments: decreased DF ROM <5 deg past neutral General Strength Comments: grossly 4+ to 5/5 throughout in sitting    Delia Slatten L Sherrin Stahle PT, DPT  07/04/2021, 8:00 AM

## 2021-07-04 NOTE — Progress Notes (Signed)
Speech Language Pathology Discharge Summary  Patient Details  Name: John Bryan MRN: [id number] Date of Birth: 03-28-1951  Today's Date: 07/04/2021 SLP Individual Time: 1215-1325 SLP Individual Time Calculation (min): 70 min   Skilled Therapeutic Interventions:  Skilled treatment session focused on dysphagia and cognitive goals as well as completion of patient and family education. SLP facilitated session by providing extra time and supervision verbal cues for donning pants prior to transfer to the wheelchair. Patient consumed his lunch meal of Dys. 3 textures with thin liquids without overt s/s of aspiration with mod I for use of swallowing compensatory strategies. Throughout meal, patient reporting vivid dreams that he knows are not real but feel like reality after a hard nap resulting in intermittent confusion and disorientation. SLP provided education regarding use of visual and contextual cues. Patient's family arrived and educated on current swallowing function, diet recommendations and appropriate textures. They were also educated on strategies to utilize at home to maximize attention, recall and overall safety. All verbalized understanding of information. Patient requested to use the urinal after he had gotten back to bed. Due to urgency, patient had been incontinent and SLP provided assistance to the patient and his significant other for donning a new brief. Patient left supine in bed with family present and all needs within reach. Continue with current plan of care.   Patient has met 9 of 9 long term goals.  Patient to discharge at [facility] level.   Reasons goals not met: N/A   Clinical Impression/Discharge Summary: Patient has made functional gains and has met 9 of 9 LTGs this admission. Currently, patient is consuming Dys. 3 textures with thin liquids with minimal overt s/s of aspiration and is overall Mod I for use of swallowing compensatory strategies. Patient demonstrates  behaviors consistent with a [place] Level VII and requires overall Supervision-Min A verbal cues to complete functional and familiar tasks safely in regards to problem solving, attention and recall. Patient's overall function can be impacted by decreased visual and hearing acuity. Patient and family education is complete and patient will discharge home with 24 hour supervision from family. Patient would benefit from f/u SLP services to maximize his cognitive functioning and overall functional independence prior to discharge.   Care Partner:  Caregiver Able to Provide Assistance: Yes  Type of Caregiver Assistance: Physical;Cognitive  Recommendation:  Home Health SLP;24 hour supervision/assistance  Rationale for SLP Follow Up: Reduce caregiver burden;Maximize cognitive function and independence;Maximize swallowing safety   Equipment: N/A   Reasons for discharge: Discharged from hospital;Treatment goals met   Patient/Family Agrees with Progress Made and Goals Achieved: Yes    Shaylie Eklund 07/04/2021, 2:01 PM
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
## 2021-07-05 ENCOUNTER — Other Ambulatory Visit (HOSPITAL_COMMUNITY): Payer: Self-pay

## 2021-07-05 DIAGNOSIS — A0472 Enterocolitis due to Clostridium difficile, not specified as recurrent: Secondary | ICD-10-CM | POA: Diagnosis not present

## 2021-07-05 DIAGNOSIS — S069X3S Unspecified intracranial injury with loss of consciousness of 1 hour to 5 hours 59 minutes, sequela: Secondary | ICD-10-CM | POA: Diagnosis not present

## 2021-07-05 DIAGNOSIS — E669 Obesity, unspecified: Secondary | ICD-10-CM

## 2021-07-05 DIAGNOSIS — E1169 Type 2 diabetes mellitus with other specified complication: Secondary | ICD-10-CM | POA: Diagnosis not present

## 2021-07-05 DIAGNOSIS — I10 Essential (primary) hypertension: Secondary | ICD-10-CM | POA: Diagnosis not present

## 2021-07-05 LAB — GLUCOSE, CAPILLARY
Glucose-Capillary: 92 mg/dL (ref 70–99)
Glucose-Capillary: 133 mg/dL — ABNORMAL HIGH (ref 70–99)

## 2021-07-05 MED ORDER — OXYCODONE HCL 5 MG PO TABS
5.0000 mg | ORAL_TABLET | Freq: Four times a day (QID) | ORAL | 0 refills | Status: AC | PRN
  Filled 2021-07-05: qty 28, 7d supply, fill #0

## 2021-07-05 MED ORDER — LEVOTHYROXINE SODIUM 75 MCG PO TABS
75.0000 ug | ORAL_TABLET | Freq: Every day | ORAL | 0 refills | Status: AC
  Filled 2021-07-05: qty 30, 30d supply, fill #0

## 2021-07-05 MED ORDER — SERTRALINE HCL 50 MG PO TABS
50.0000 mg | ORAL_TABLET | Freq: Every day | ORAL | 0 refills | Status: AC
  Filled 2021-07-05: qty 30, 30d supply, fill #0

## 2021-07-05 MED ORDER — POTASSIUM CHLORIDE 20 MEQ PO PACK
40.0000 meq | PACK | Freq: Two times a day (BID) | ORAL | 0 refills | Status: AC
  Filled 2021-07-05: qty 60, 15d supply, fill #0

## 2021-07-05 MED ORDER — PROPRANOLOL HCL 40 MG PO TABS
40.0000 mg | ORAL_TABLET | Freq: Three times a day (TID) | ORAL | 0 refills | Status: AC
  Filled 2021-07-05: qty 90, 30d supply, fill #0

## 2021-07-05 MED ORDER — ONDANSETRON HCL 4 MG PO TABS
4.0000 mg | ORAL_TABLET | Freq: Three times a day (TID) | ORAL | 0 refills | Status: AC | PRN
  Filled 2021-07-05: qty 20, 7d supply, fill #0

## 2021-07-05 MED ORDER — PSYLLIUM 58.12 % PO PACK
1.0000 | PACK | Freq: Two times a day (BID) | ORAL | 0 refills | Status: AC
  Filled 2021-07-05: qty 30, 15d supply, fill #0

## 2021-07-05 MED ORDER — FAMOTIDINE 20 MG PO TABS
20.0000 mg | ORAL_TABLET | Freq: Two times a day (BID) | ORAL | 0 refills | Status: AC
  Filled 2021-07-05: qty 60, 30d supply, fill #0

## 2021-07-05 MED ORDER — VANCOMYCIN HCL 125 MG PO CAPS
ORAL_CAPSULE | ORAL | 0 refills | Status: AC
  Filled 2021-07-05: qty 24, 19d supply, fill #0

## 2021-07-05 MED ORDER — APIXABAN 5 MG PO TABS
5.0000 mg | ORAL_TABLET | Freq: Two times a day (BID) | ORAL | 0 refills | Status: AC
  Filled 2021-07-05: qty 60, 30d supply, fill #0

## 2021-07-05 MED ORDER — CHOLESTYRAMINE 4 G PO PACK
4.0000 g | PACK | Freq: Two times a day (BID) | ORAL | 12 refills | Status: AC
  Filled 2021-07-05: qty 60, 30d supply, fill #0

## 2021-07-05 MED ORDER — QUETIAPINE FUMARATE 25 MG PO TABS: 25.0000 mg | ORAL_TABLET | Freq: Every day | ORAL | Status: AC

## 2021-07-05 MED ORDER — TOPIRAMATE 25 MG PO TABS
25.0000 mg | ORAL_TABLET | Freq: Two times a day (BID) | ORAL | 0 refills | Status: AC
  Filled 2021-07-05: qty 60, 30d supply, fill #0

## 2021-07-05 MED ORDER — POLYVINYL ALCOHOL 1.4 % OP SOLN
2.0000 [drp] | Freq: Three times a day (TID) | OPHTHALMIC | 0 refills | Status: AC
  Filled 2021-07-05: qty 15, 50d supply, fill #0

## 2021-07-05 MED ORDER — INSULIN ASPART PROT & ASPART (70-30 MIX) 100 UNIT/ML PEN
18.0000 [IU] | PEN_INJECTOR | Freq: Two times a day (BID) | SUBCUTANEOUS | 0 refills | Status: AC
  Filled 2021-07-05: qty 12, 30d supply, fill #0

## 2021-07-05 MED ORDER — METHOCARBAMOL 500 MG PO TABS
500.0000 mg | ORAL_TABLET | Freq: Three times a day (TID) | ORAL | 0 refills | Status: AC
  Filled 2021-07-05: qty 90, 30d supply, fill #0

## 2021-07-05 MED ORDER — TRAMADOL HCL 50 MG PO TABS
100.0000 mg | ORAL_TABLET | Freq: Two times a day (BID) | ORAL | 0 refills | Status: AC
  Filled 2021-07-05: qty 28, 7d supply, fill #0

## 2021-07-05 MED ORDER — FREE WATER: 100.0000 mL | Freq: Four times a day (QID) | Status: AC

## 2021-07-05 MED ORDER — VITAMIN D 25 MCG (1000 UNIT) PO TABS: 1000.0000 [IU] | ORAL_TABLET | Freq: Every day | ORAL | Status: AC

## 2021-07-05 MED ORDER — INSULIN PEN NEEDLE 32G X 4 MM MISC
1.0000 "application " | Freq: Two times a day (BID) | 0 refills | Status: AC
  Filled 2021-07-05: qty 100, 30d supply, fill #0

## 2021-07-05 MED ORDER — CLONIDINE HCL 0.1 MG PO TABS
0.1000 mg | ORAL_TABLET | Freq: Three times a day (TID) | ORAL | 0 refills | Status: DC
  Filled 2021-07-05: qty 90, 30d supply, fill #0
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
## 2021-07-05 NOTE — Progress Notes (Signed)
Patient discharged at 1324. Patient Discharged with family and staff transported by wheelchair Patient was anxious to leave. Patient AXOX3. Doree Fudge, LPN

## 2021-07-05 NOTE — Progress Notes (Signed)
Inpatient Rehabilitation Care Coordinator Discharge Note  The overall goal for the admission was met for:   Discharge location: D/c to home with 24/7 care from family.   Length of Stay: 31 days.   Discharge activity level: Ambulatory level for household mobility, wheelchair community level Min Assist; Min A with ADLs/IADLs.  Home/community participation: Yes  Services provided included: MD, RD, PT, OT, SLP, RN, CM, TR, Pharmacy, Neuropsych, and SW  Financial Services: Medicare and Private Insurance: Tricare for [employer] offered to/list presented to:Yes  Follow-up services arranged: Home Health: [facility] for HHPT/OT/SLP/aide/SN, DME: [facility] for RW, w/c, 3in1 Bsc and TTB, and Patient/Family request agency HH: [facility], DME: NA  Comments (or additional information):  Patient/Family verbalized understanding of follow-up arrangements: Yes  Individual responsible for coordination of the follow-up plan: Contact pt dtr [name] [phone]  Confirmed correct DME delivered: Rana Snare 07/05/2021    Rana Snare

## 2021-07-07 ENCOUNTER — Telehealth: Payer: Self-pay | Admitting: Physical Medicine and Rehabilitation

## 2021-07-07 ENCOUNTER — Other Ambulatory Visit (HOSPITAL_BASED_OUTPATIENT_CLINIC_OR_DEPARTMENT_OTHER): Payer: Self-pay

## 2021-07-07 NOTE — Telephone Encounter (Signed)
Esmond Harps RN with [facility] needs continued Home Health orders for PT, SN, OT and ST.  Please call her at [phone].

## 2021-07-07 NOTE — Telephone Encounter (Signed)
HHRN 1wk8

## 2021-07-07 NOTE — Telephone Encounter (Signed)
Orders approved and given to Esmond Harps, RN LVM

## 2021-07-18 ENCOUNTER — Encounter
Payer: Medicare Other | Attending: Physical Medicine and Rehabilitation | Admitting: Physical Medicine and Rehabilitation

## 2021-07-18 ENCOUNTER — Encounter: Payer: Self-pay | Admitting: Physical Medicine and Rehabilitation

## 2021-07-18 ENCOUNTER — Other Ambulatory Visit (HOSPITAL_COMMUNITY): Payer: Self-pay

## 2021-07-18 ENCOUNTER — Other Ambulatory Visit: Payer: Self-pay

## 2021-07-18 ENCOUNTER — Telehealth (HOSPITAL_COMMUNITY): Payer: Self-pay

## 2021-07-18 VITALS — BP 127/79 | HR 55 | Temp 98.2°F | Ht 75.0 in | Wt 223.0 lb

## 2021-07-18 DIAGNOSIS — Z431 Encounter for attention to gastrostomy: Secondary | ICD-10-CM | POA: Insufficient documentation

## 2021-07-18 DIAGNOSIS — S066X9S Traumatic subarachnoid hemorrhage with loss of consciousness of unspecified duration, sequela: Secondary | ICD-10-CM | POA: Diagnosis present

## 2021-07-18 MED ORDER — TAMSULOSIN HCL 0.4 MG PO CAPS: 0.4000 mg | ORAL_CAPSULE | Freq: Every day | ORAL | 3 refills | Status: AC

## 2021-07-18 MED ORDER — DEBROX 6.5 % OT SOLN: 5.0000 [drp] | Freq: Two times a day (BID) | OTIC | 0 refills | Status: AC

## 2021-07-18 NOTE — Telephone Encounter (Signed)
Pharmacy Transitions of Care Follow-up Telephone Call  Date of discharge: 07/05/21  Discharge Diagnosis: Traumatic Brain Injury  How have you been since you were released from the hospital?  Patient has been doing well since discharge, spoke to daughter on the phone and wife was present in phone call. No questions about meds at this time.  Medication changes made at discharge:       START taking: cholestyramine (QUESTRAN)  Metamucil [facility] Fiber (Psyllium)  methocarbamol (ROBAXIN)  NovoLOG Mix 70/30 FlexPen (insulin aspart protamine - aspart)  ondansetron (ZOFRAN)  Pentips (Insulin Pen Needle)  polyvinyl alcohol (LIQUIFILM TEARS)  potassium chloride (KLOR-CON)  topiramate (TOPAMAX)  traMADol (ULTRAM)  vancomycin (VANCOCIN)  CHANGE how you take: cholecalciferol (VITAMIN D3)  cloNIDine (CATAPRES)  Eliquis (apixaban)  famotidine (PEPCID)  oxyCODONE (Oxy IR/ROXICODONE)  propranolol (INDERAL)  QUEtiapine (SEROQUEL)  sertraline (ZOLOFT)  STOP taking: acetaminophen 325 MG tablet (TYLENOL)  Amantadine HCl 100 MG tablet  ciprofloxacin 400 MG/200ML Soln (CIPRO)  Digestive Advantage Caps  fiber Pack packet  FISH OIL PO  insulin lispro 100 UNIT/ML injection (HUMALOG)  insulin NPH-regular Human (70-30) 100 UNIT/ML injection  ipratropium-albuterol 0.5-2.5 (3) MG/3ML Soln (DUONEB)  melatonin 3 MG Tabs tablet  modafinil 100 MG tablet (PROVIGIL)  traZODone 50 MG tablet (DESYREL)   Medication changes verified by the patient? Yes    Medication Accessibility:  Home Pharmacy:  [facility] [address] in [place]  Was the patient provided with refills on discharged medications? None for Apixaban, has partial refill on 3 meds   Have all prescriptions been transferred from [facility] to home pharmacy?  Yes  Is the patient able to afford medications? Patient has Tricare insurance    Medication Review:   APIXABAN (ELIQUIS)  Apixaban 5 mg BID initiated on 07/05/21.   - Discussed  importance of taking medication around the same time everyday  - Advised patient of medications to avoid (NSAIDs, ASA)  - Educated that Tylenol (acetaminophen) will be the preferred analgesic to prevent risk of bleeding  - Emphasized importance of monitoring for signs and symptoms of bleeding (abnormal bruising, prolonged bleeding, nose bleeds, bleeding from gums, discolored urine, black tarry stools)  - Advised patient to alert all providers of anticoagulation therapy prior to starting a new medication or having a procedure   Follow-up Appointments:  PCP Hospital f/u appt confirmed?  Scheduled to see PCP on 07/13/21.   Specialist Hospital f/u appt confirmed?  Scheduled to see Dr. Carlis Abbott on 07/18/21 @ [facility].   If their condition worsens, is the pt aware to call PCP or go to the Emergency Dept.? Yes  Final Patient Assessment: Patient has refills at home pharmacy and follow up scheduled.

## 2021-07-18 NOTE — Progress Notes (Signed)
Subjective:    Patient ID: John Bryan, male    DOB: June 14, 1951, 70 y.o.   MRN: [id number]  HPI John Bryan is a 70 year old man who presents for transitional care follow-up after CIR admission for TBI  1) PEG in place -it has been in place close to 90 days, they were told it should stay in place close to 30 days.   2) Frequent urination -not getting any sleep due to this.  -wearing depends at night -he drinks a lot of tonic and tea.  -his family has been trying to give him less water.   3) Cognitive deficits -noted his by his wife and daughter  4) Swelling in bilateral lower extremities  5) Impaired mobility  -his daughter has been having him stand and walk a little every hour -he has been receiving therapy  6) Decreased hearing: -he lost his hearing aids.   7) HTN -has been taking clonidine and propanolol.   Pain Inventory Average Pain 6 Pain Right Now 6 My pain is constant and aching  LOCATION OF PAIN  back buttocks thigh , knee , leg ankle,   BOWEL Number of stools per week: 4 Oral laxative use No  Type of laxative n/a Enema or suppository use No  History of colostomy No  Incontinent No   BLADDER Pads  Able to self cath No  Bladder incontinence Yes  Frequent urination Yes  Leakage with coughing No  Difficulty starting stream Yes  Incomplete bladder emptying Yes    Mobility use a walker how many minutes can you walk? 5 mins ability to climb steps?  yes use a wheelchair transfers alone  Function retired  Neuro/Psych bladder control problems weakness trouble walking dizziness confusion  Prior Studies New pt   Physicians involved in your care New pt   No family history on file. Social History   Socioeconomic History   Marital status: Divorced    Spouse name: Not on file   Number of children: Not on file   Years of education: Not on file   Highest education level: Not on file  Occupational History   Not on file   Tobacco Use   Smoking status: Former    Types: Cigarettes   Smokeless tobacco: Never  Vaping Use   Vaping Use: Never used  Substance and Sexual Activity   Alcohol use: Not Currently   Drug use: Not Currently   Sexual activity: Yes    Partners: Female  Other Topics Concern   Not on file  Social History Narrative   Not on file   Social Determinants of Health   Financial Resource Strain: Not on file  Food Insecurity: Not on file  Transportation Needs: Not on file  Physical Activity: Not on file  Stress: Not on file  Social Connections: Not on file   No past surgical history on file. Past Medical History:  Diagnosis Date   DM (diabetes mellitus) (HCC)    HOH (hard of hearing)    HTN (hypertension)    BP 127/79   Pulse (!) 55   Temp 98.2 F (36.8 C) (Oral)   Ht 6\' 3"  (1.905 m)   Wt 223 lb (101.2 kg)   SpO2 97%   BMI 27.87 kg/m   Opioid Risk Score:   Fall Risk Score:  `1  Depression screen PHQ 2/9  No flowsheet data found.    Review of Systems  Constitutional:  Positive for unexpected weight change.  HENT: Negative.  Eyes: Negative.   Respiratory: Negative.    Cardiovascular: Negative.   Gastrointestinal: Negative.   Endocrine: Negative.   Genitourinary:        Urine retention  Musculoskeletal:  Positive for back pain and gait problem.       Pain in buttocks, pain in knees, pain in ankle.  Skin: Negative.   Allergic/Immunologic: Negative.   Neurological:  Positive for dizziness and weakness.  Hematological: Negative.   Psychiatric/Behavioral:  Positive for confusion.       Objective:   Physical Exam  Gen: no distress, normal appearing HEENT: oral mucosa pink and moist, NCAT, eye patch left eye  Cardio: Reg rate Chest: normal effort, normal rate of breathing Abd: soft, non-distended Ext: no edema Psych: pleasant, normal affect Skin: intact Neuro: Alert and oriented Musculoskeletal: 4/5 right sided strength, and 5/5 left side       Assessment & Plan:  John Bryan is a 70 year old man who presents for transitional care visit after CIR admission for TBI.   1) PEG in place -it has been in place close to 90 days, they were told it should stay in place close to 90 days.  -referred to interventional radiology for its removal.  2) Frequent urination -not getting any sleep due to this.  -wearing depends at night -he drinks a lot of tonic and tea.  -due to dry mouth, he drinks water frequently -swish ice chips around mouth -prescribed flomax at night  3) Cognitive deficits -noted his by his wife and daughter -continue to work with SLP.   4) Swelling in bilateral lower extremities - elevating the legs, wear compression garments.   5) Impaired mobility  -his daughter has been having him stand and walk a little every hour -he has been receiving therapy  6) Decreased hearing: -he lost his hearing aids.   7) HTN -has been taking clonidine and propanolol.  -stopped the clonidine which can help decrease the dry mouth -flomax prescribed for frequent urination will also help maintain current normal blood pressure HTN: -Advised checking BP daily at home and logging results to bring into follow-up appointment with her PCP and myself. -Reviewed BP meds today.  -Advised regarding healthy foods that can help lower blood pressure and provided with a list: 1) citrus foods- high in vitamins and minerals 2) salmon and other fatty fish - reduces inflammation and oxylipins 3) swiss chard (leafy green)- high level of nitrates 4) pumpkin seeds- one of the best natural sources of magnesium 5) Beans and lentils- high in fiber, magnesium, and potassium 6) Berries- high in flavonoids 7) Amaranth (whole grain, can be cooked similarly to rice and oats)- high in magnesium and fiber 8) Pistachios- even more effective at reducing BP than other nuts 9) Carrots- high in phenolic compounds that relax blood vessels and reduce  inflammation 10) Celery- contain phthalides that relax tissues of arterial walls 11) Tomatoes- can also improve cholesterol and reduce risk of heart disease 12) Broccoli- good source of magnesium, calcium, and potassium 13) Greek yogurt: high in potassium and calcium 14) Herbs and spices: Celery seed, cilantro, saffron, lemongrass, black cumin, ginseng, cinnamon, cardamom, sweet basil, and ginger 15) Chia and flax seeds- also help to lower cholesterol and blood sugar 16) Beets- high levels of nitrates that relax blood vessels  17) spinach and bananas- high in potassium  -Provided lise of supplements that can help with hypertension:  1) magnesium: one high quality brand is Bioptemizers since it contains all 7 types of  magnesium, otherwise over the counter magnesium gluconate 400mg  is a good option 2) B vitamins 3) vitamin D 4) potassium 5) CoQ10 6) L-arginine 7) Vitamin C 8) Beetroot -Educated that goal BP is 120/80. -Made goal to incorporate some of the above foods into diet.    8) overweight: -continue to monitor with PCP  Current impairments: Impaired mobility, cognition, vision  The patient's medical and/or psychosocial problems require moderate decision-making during transitions in care from inpatient rehabilitation to home. This transitional care appointment included review of the patient's hospital discharge summary, review of the patient's hospital diagnostic tests and discussion of appropriate follow-up, education of the patient regarding their condition, re-establishment of necessary referrals. I will be reviewing patient's home and/or outpatient therapy notes as they progress through therapy and corresponding with therapists accordingly. I have encouraged compliance with current medication regimen (with adjustment to regimen as needed), follow-up with necessary providers, and the importance of following a healthy diet and exercise routine to maximize recovery, health, and quality  of life.

## 2021-07-18 NOTE — Patient Instructions (Signed)
HTN: 1) citrus foods- high in vitamins and minerals 2) salmon and other fatty fish - reduces inflammation and oxylipins 3) swiss chard (leafy green)- high level of nitrates 4) pumpkin seeds- one of the best natural sources of magnesium 5) Beans and lentils- high in fiber, magnesium, and potassium 6) Berries- high in flavonoids 7) Amaranth (whole grain, can be cooked similarly to rice and oats)- high in magnesium and fiber 8) Pistachios- even more effective at reducing BP than other nuts 9) Carrots- high in phenolic compounds that relax blood vessels and reduce inflammation 10) Celery- contain phthalides that relax tissues of arterial walls 11) Tomatoes- can also improve cholesterol and reduce risk of heart disease 12) Broccoli- good source of magnesium, calcium, and potassium 13) Greek yogurt: high in potassium and calcium 14) Herbs and spices: Celery seed, cilantro, saffron, lemongrass, black cumin, ginseng, cinnamon, cardamom, sweet basil, and ginger 15) Chia and flax seeds- also help to lower cholesterol and blood sugar 16) Beets- high levels of nitrates that relax blood vessels  17) spinach and bananas- high in potassium 

## 2021-07-21 ENCOUNTER — Other Ambulatory Visit (HOSPITAL_COMMUNITY): Payer: Self-pay | Admitting: Physical Medicine and Rehabilitation

## 2022-12-02 IMAGING — CR DG ABDOMEN 1V
2 series · 2 of 2 positions shown · non-contrast
Comparison: 05/23/2021

CLINICAL DATA: Gastrostomy.  Nausea.

EXAM:
ABDOMEN - 1 VIEW

[abdomen kub (1 of 2)]
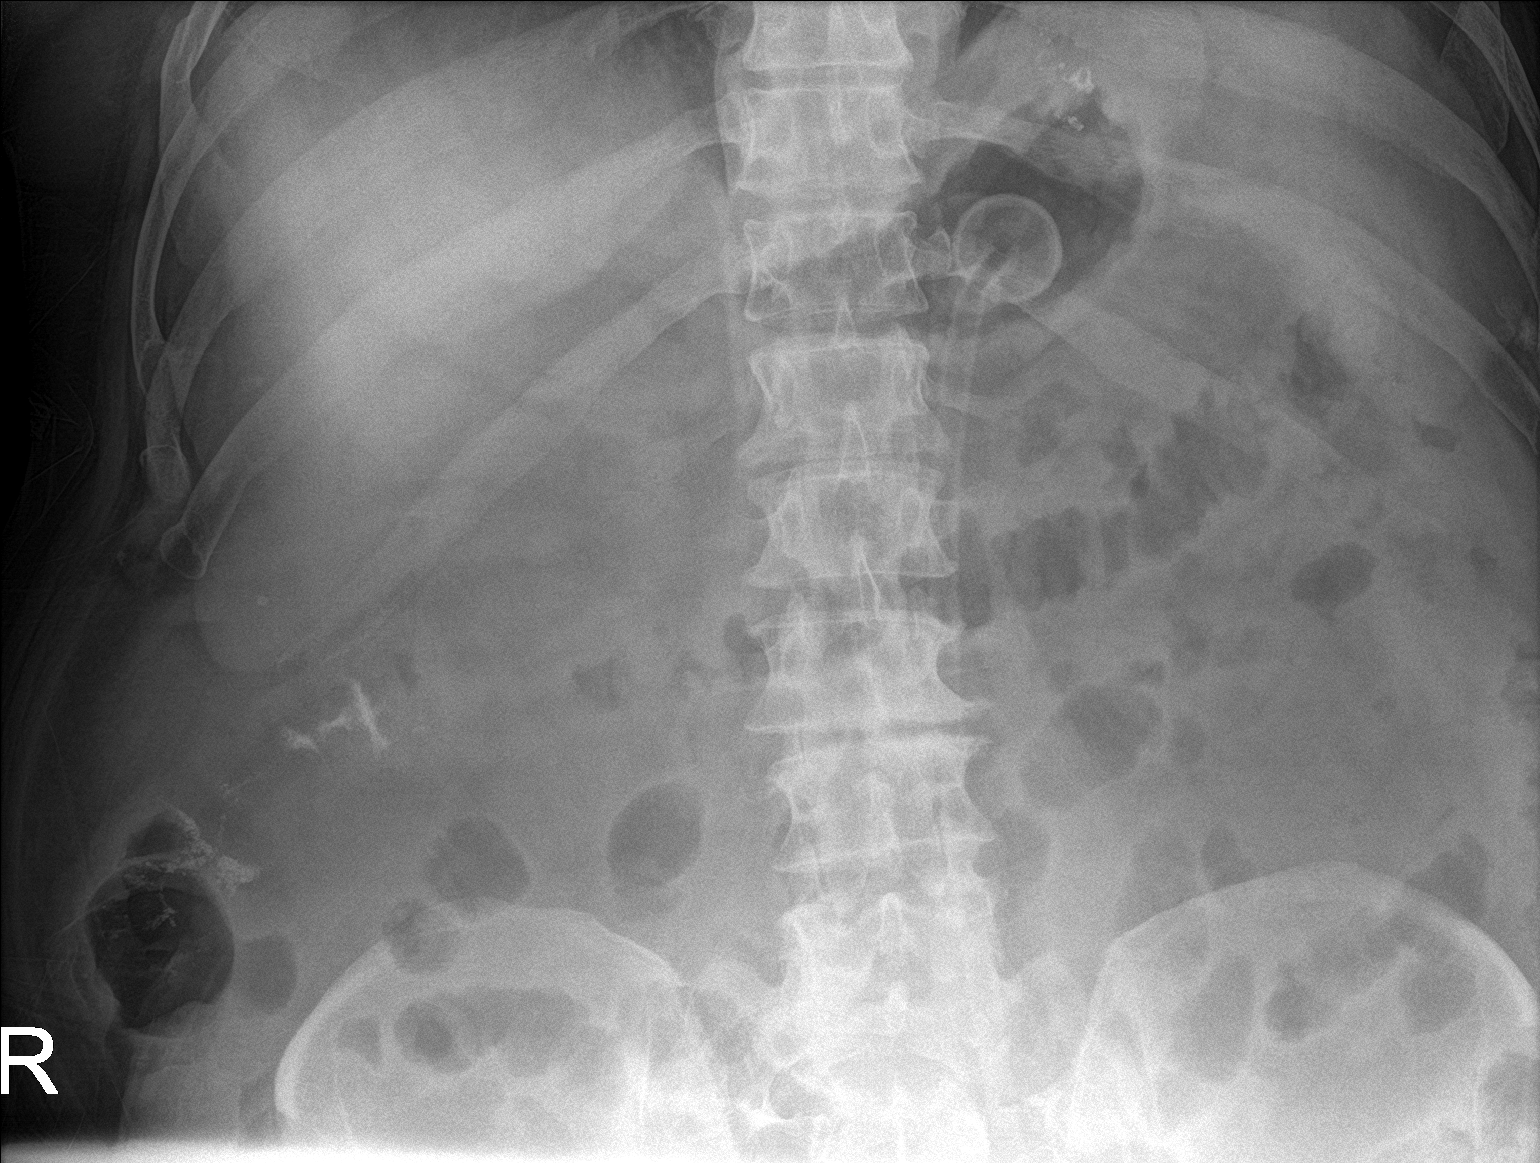

[abdomen kub (2 of 2)]
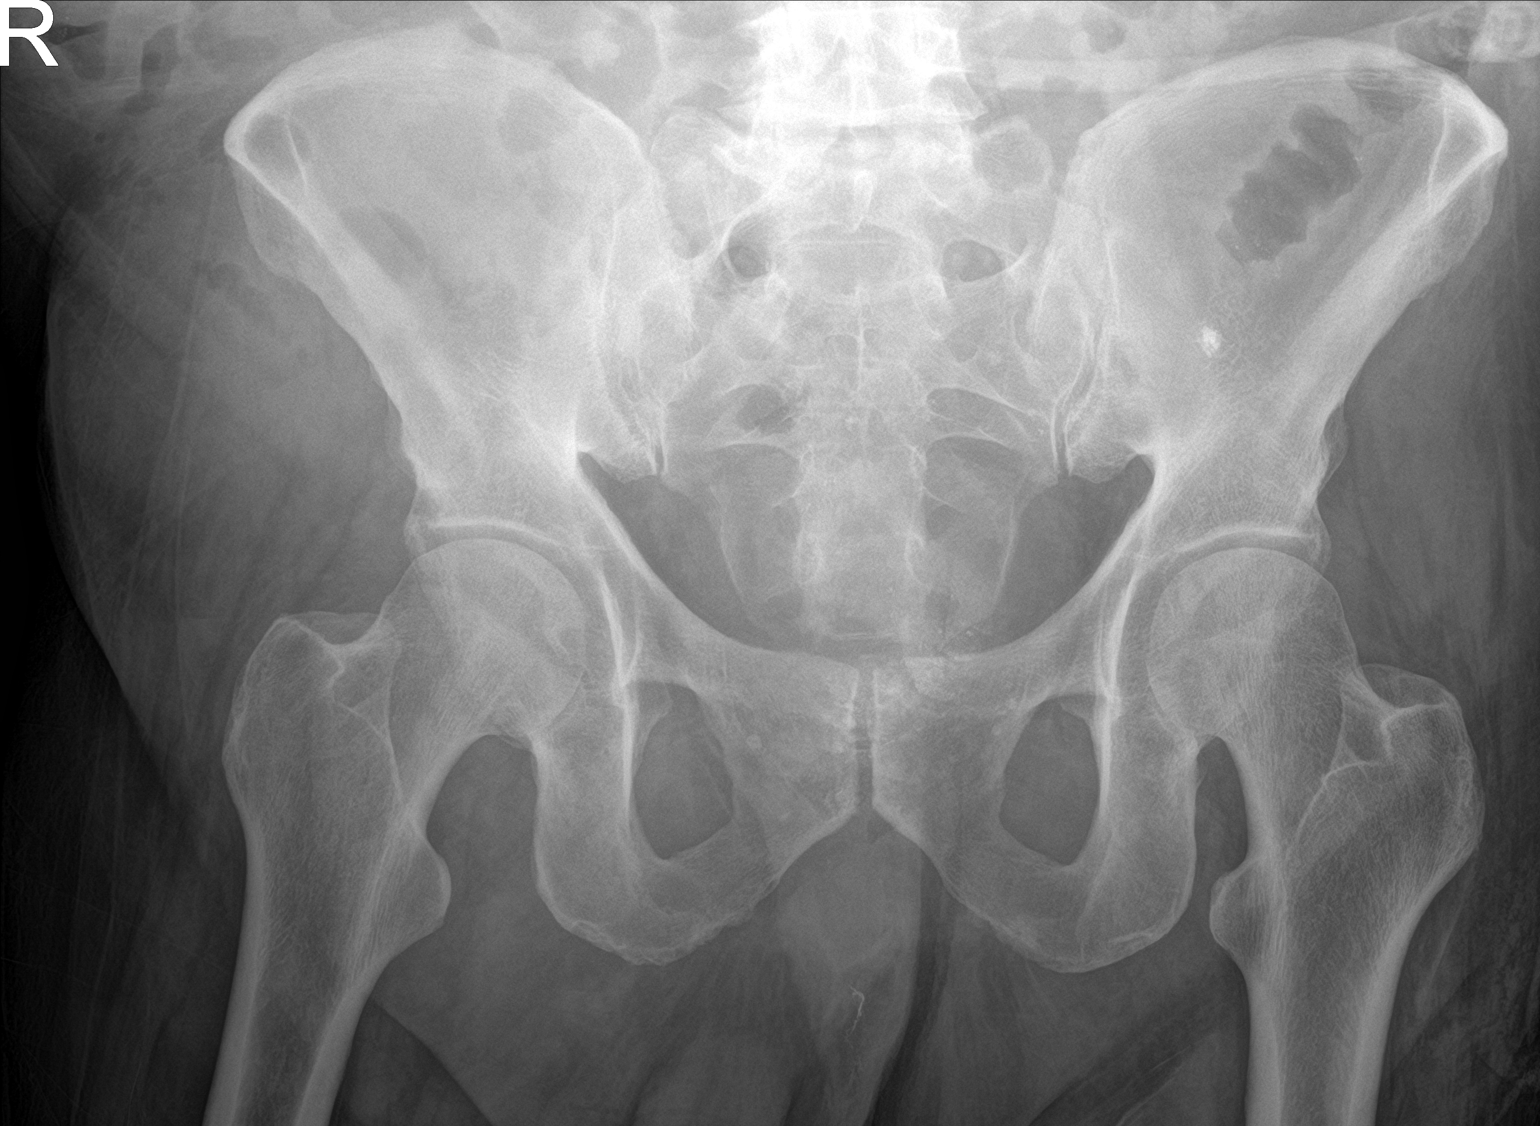

[2 of 2 positions shown; findings below may reference images not displayed]

FINDINGS: Gastrostomy tube appears to be within the stomach. Stomach is not
distended. Small and large bowel pattern is normal. No sign of ileus
or obstruction. No abnormal calcifications or significant bone
findings. Ordinary mild lumbar degenerative changes.
IMPRESSION: Gastrostomy tube apparently well-positioned. No abnormality of the
bowel gas pattern.

## 2022-12-10 IMAGING — DX DG LUMBAR SPINE 2-3V
3 series · 3 of 3 positions shown · non-contrast
Comparison: None.

CLINICAL DATA: Lower back pain, altered level of consciousness

EXAM:
LUMBAR SPINE - 2-3 VIEW

[l-spine lat]
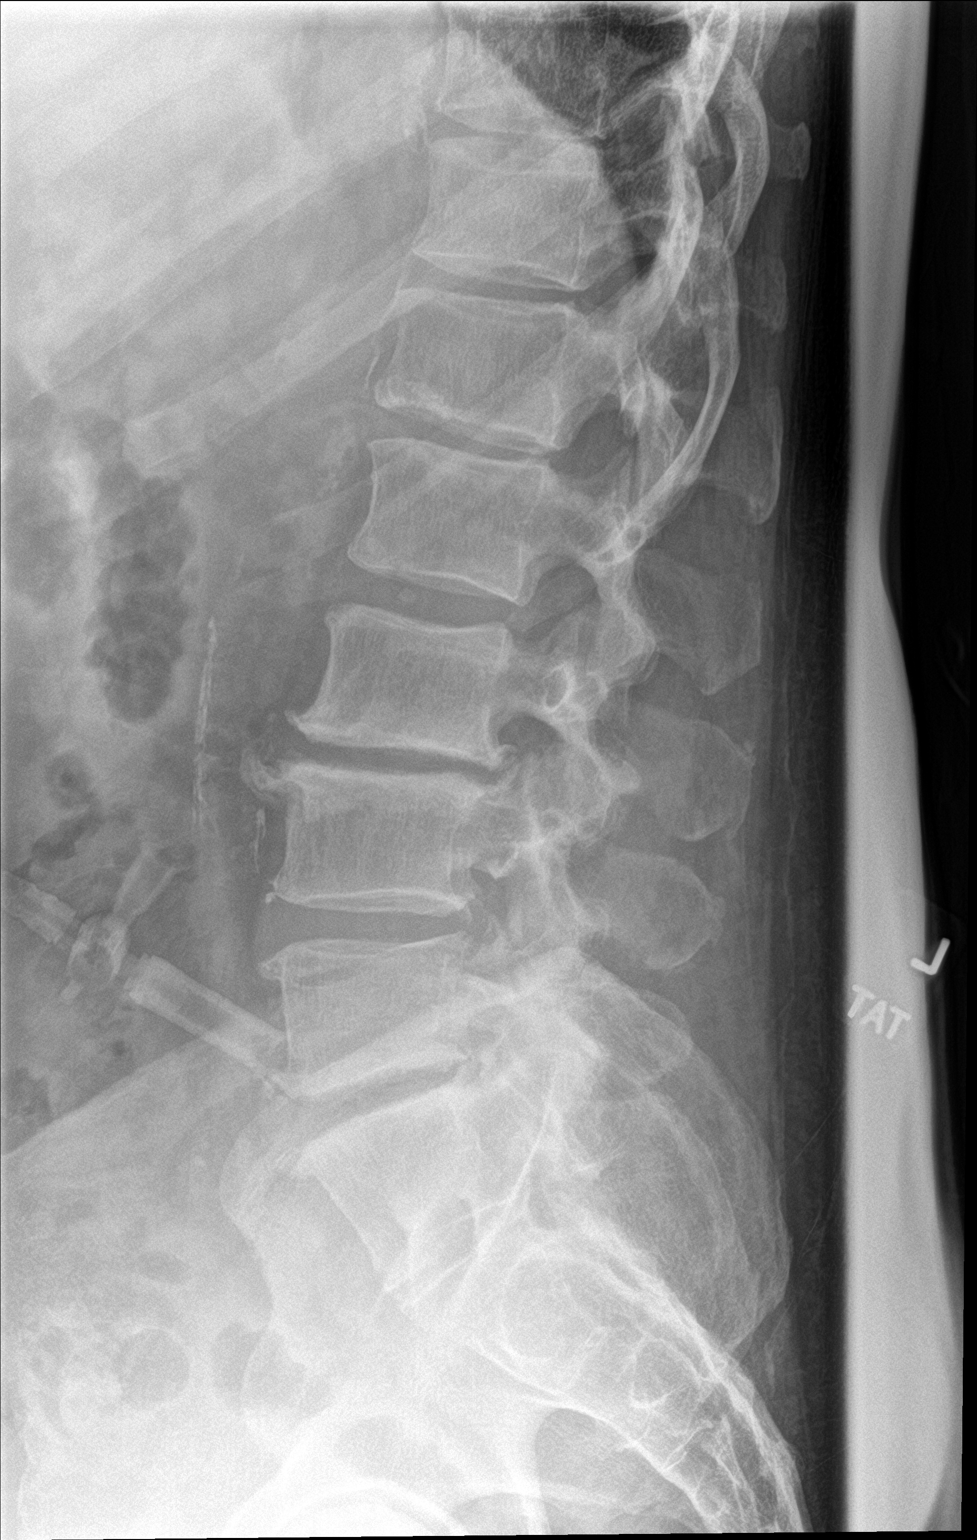

[l-spine spot]
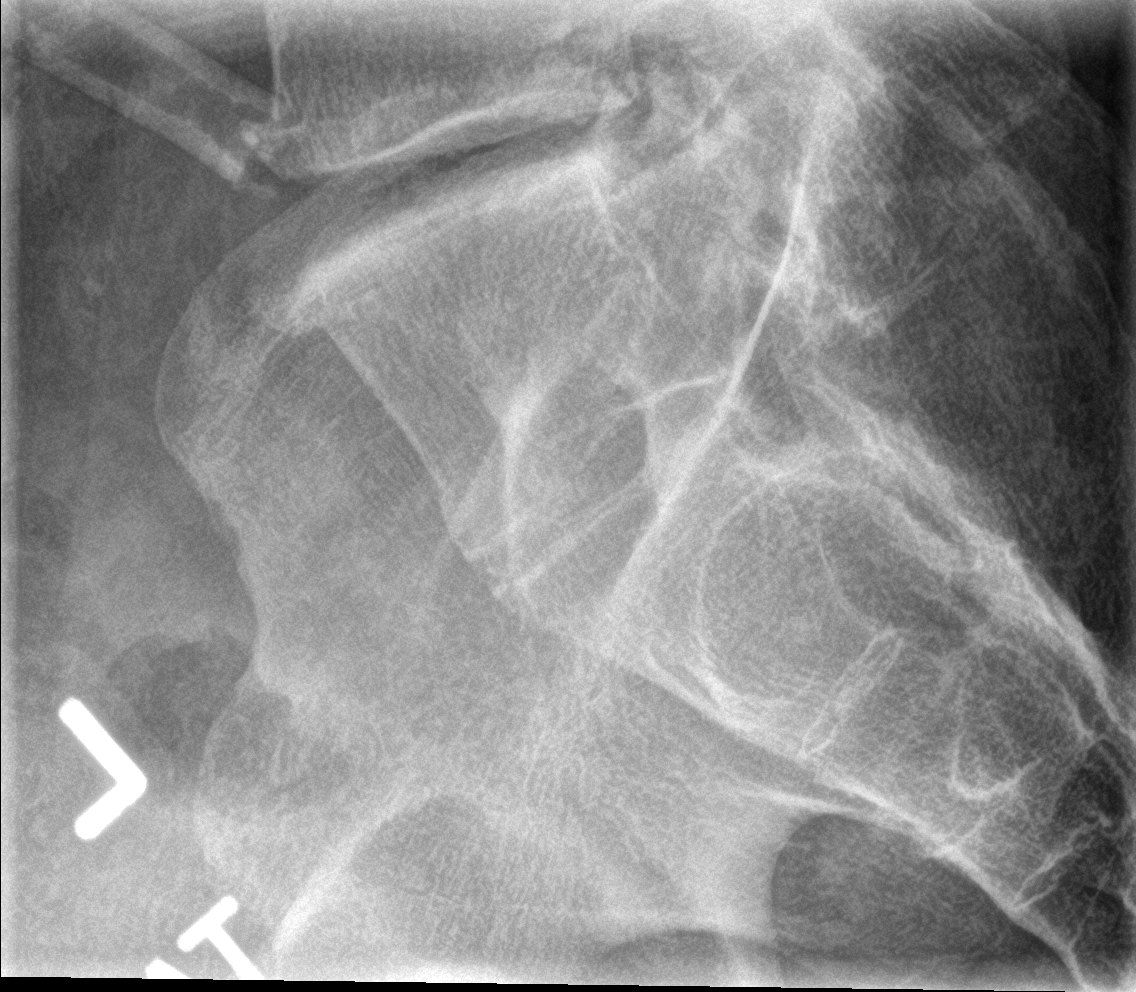

[l-spine ap]
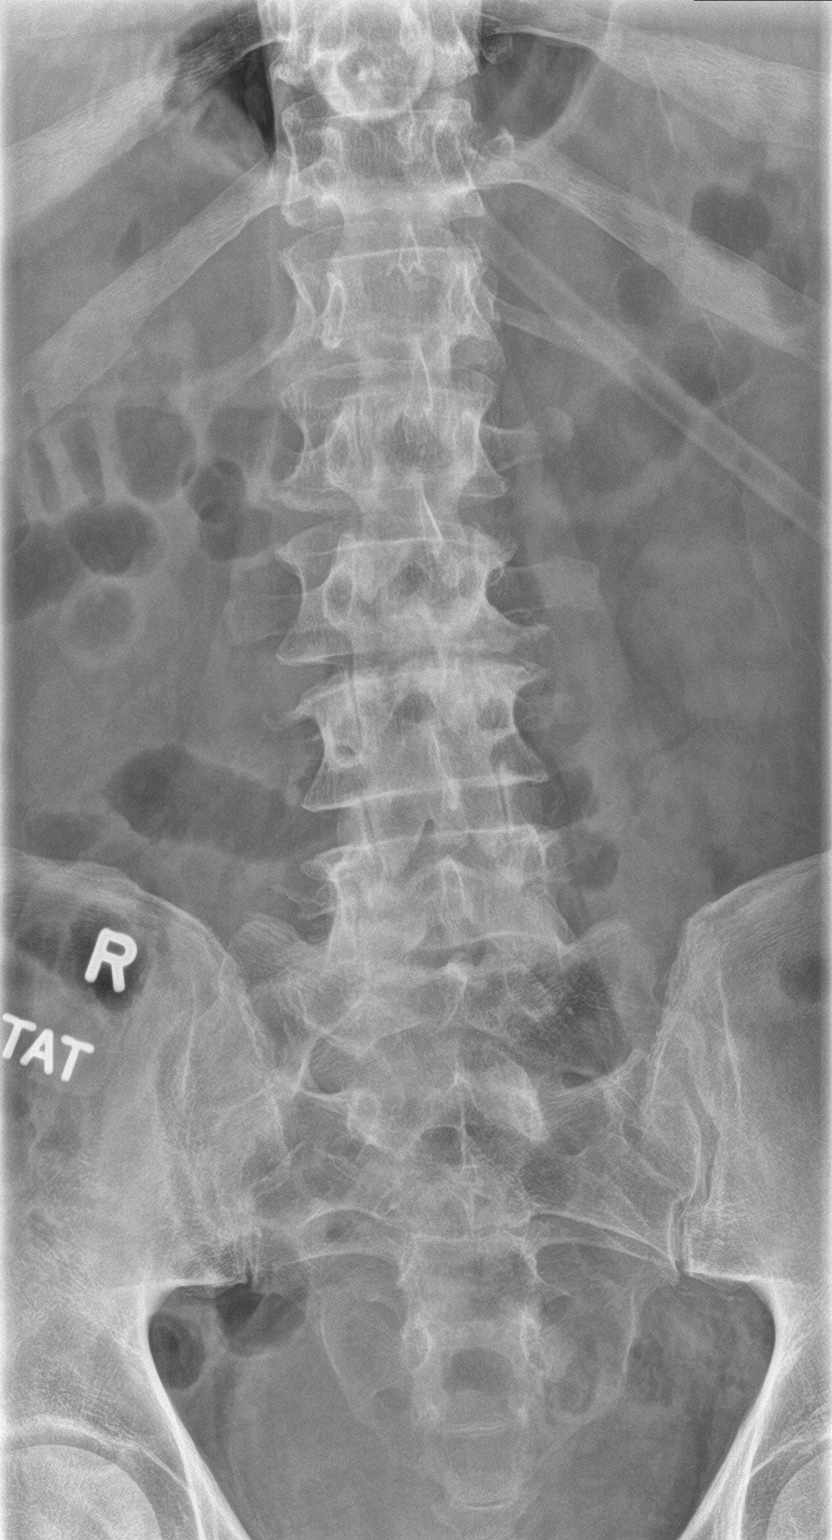

[3 of 3 positions shown; findings below may reference images not displayed]

FINDINGS: Frontal and lateral views of the lumbar spine are obtained. There
are 4 non-rib-bearing lumbar type vertebral bodies. The last
complete disc space will be designated as L4/S1. There is prominent
spondylosis at L2-3 and L4-S1, with prominent disc space narrowing
and circumferential osteophyte formation. Prominent facet
hypertrophic changes are seen at the lumbosacral junction. There are
no acute displaced fractures. Sacroiliac joints are normal.
IMPRESSION: 1. Four non-rib-bearing lumbar type vertebral bodies.
2. Multilevel lumbar spondylosis and facet hypertrophy. No acute
fracture.
